# Patient Record
Sex: Female | Born: 1975 | Race: White | Hispanic: No | Marital: Single | Smoking: Never smoker
Health system: Southern US, Community
[De-identification: ages and names within clinical notes are randomized; demographics above are authoritative.]

## PROBLEM LIST (undated history)

## (undated) DIAGNOSIS — F329 Major depressive disorder, single episode, unspecified: Secondary | ICD-10-CM

## (undated) DIAGNOSIS — E039 Hypothyroidism, unspecified: Secondary | ICD-10-CM

## (undated) DIAGNOSIS — F32A Depression, unspecified: Secondary | ICD-10-CM

## (undated) DIAGNOSIS — F419 Anxiety disorder, unspecified: Secondary | ICD-10-CM

## (undated) HISTORY — PX: COSMETIC SURGERY: SHX468

---

## 2014-08-23 ENCOUNTER — Inpatient Hospital Stay: Payer: Self-pay | Admitting: Psychiatry

## 2014-08-23 LAB — URINALYSIS, COMPLETE
Bacteria: NONE SEEN
Bilirubin,UR: NEGATIVE
GLUCOSE, UR: NEGATIVE mg/dL (ref 0–75)
KETONE: NEGATIVE
LEUKOCYTE ESTERASE: NEGATIVE
Nitrite: NEGATIVE
PH: 6 (ref 4.5–8.0)
Protein: 30
Specific Gravity: 1.02 (ref 1.003–1.030)
Squamous Epithelial: 4
WBC UR: 12 /HPF (ref 0–5)

## 2014-08-23 LAB — DRUG SCREEN, URINE
Amphetamines, Ur Screen: NEGATIVE (ref ?–1000)
BARBITURATES, UR SCREEN: NEGATIVE (ref ?–200)
Benzodiazepine, Ur Scrn: POSITIVE (ref ?–200)
CANNABINOID 50 NG, UR ~~LOC~~: NEGATIVE (ref ?–50)
COCAINE METABOLITE, UR ~~LOC~~: NEGATIVE (ref ?–300)
MDMA (ECSTASY) UR SCREEN: NEGATIVE (ref ?–500)
Methadone, Ur Screen: NEGATIVE (ref ?–300)
OPIATE, UR SCREEN: NEGATIVE (ref ?–300)
Phencyclidine (PCP) Ur S: NEGATIVE (ref ?–25)
Tricyclic, Ur Screen: NEGATIVE (ref ?–1000)

## 2014-08-23 LAB — COMPREHENSIVE METABOLIC PANEL
ALK PHOS: 57 U/L
ALT: 22 U/L
Albumin: 4 g/dL (ref 3.4–5.0)
Anion Gap: 9 (ref 7–16)
BILIRUBIN TOTAL: 0.5 mg/dL (ref 0.2–1.0)
BUN: 7 mg/dL (ref 7–18)
Calcium, Total: 9.2 mg/dL (ref 8.5–10.1)
Chloride: 110 mmol/L — ABNORMAL HIGH (ref 98–107)
Co2: 26 mmol/L (ref 21–32)
Creatinine: 0.8 mg/dL (ref 0.60–1.30)
EGFR (African American): >60
EGFR (Non-African Amer.): >60
Glucose: 73 mg/dL (ref 65–99)
Osmolality: 285 (ref 275–301)
Potassium: 3.6 mmol/L (ref 3.5–5.1)
SGOT(AST): 28 U/L (ref 15–37)
Sodium: 145 mmol/L (ref 136–145)
Total Protein: 7.5 g/dL (ref 6.4–8.2)

## 2014-08-23 LAB — CBC
HCT: 42.3 % (ref 35.0–47.0)
HGB: 14.1 g/dL (ref 12.0–16.0)
MCH: 29.4 pg (ref 26.0–34.0)
MCHC: 33.3 g/dL (ref 32.0–36.0)
MCV: 88 fL (ref 80–100)
Platelet: 249 10*3/uL (ref 150–440)
RBC: 4.78 10*6/uL (ref 3.80–5.20)
RDW: 13.8 % (ref 11.5–14.5)
WBC: 8.6 10*3/uL (ref 3.6–11.0)

## 2014-08-23 LAB — SALICYLATE LEVEL: Salicylates, Serum: <1.7 mg/dL

## 2014-08-23 LAB — ACETAMINOPHEN LEVEL

## 2014-08-23 LAB — ETHANOL: ETHANOL LVL: 4 mg/dL

## 2014-08-23 LAB — TSH: Thyroid Stimulating Horm: 10.4 u[IU]/mL — ABNORMAL HIGH
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
## 2015-01-11 ENCOUNTER — Ambulatory Visit: Payer: Medicaid Other | Admitting: Psychiatry

## 2015-01-14 ENCOUNTER — Ambulatory Visit (INDEPENDENT_AMBULATORY_CARE_PROVIDER_SITE_OTHER): Payer: Medicaid Other | Admitting: Psychiatry

## 2015-01-14 ENCOUNTER — Encounter: Payer: Self-pay | Admitting: Psychiatry

## 2015-01-14 VITALS — BP 114/76 | HR 82 | Resp 12 | Ht 67.0 in

## 2015-01-14 DIAGNOSIS — F332 Major depressive disorder, recurrent severe without psychotic features: Secondary | ICD-10-CM | POA: Diagnosis not present

## 2015-01-14 DIAGNOSIS — Z8639 Personal history of other endocrine, nutritional and metabolic disease: Secondary | ICD-10-CM | POA: Insufficient documentation

## 2015-01-14 DIAGNOSIS — G4700 Insomnia, unspecified: Secondary | ICD-10-CM | POA: Insufficient documentation

## 2015-01-14 DIAGNOSIS — F411 Generalized anxiety disorder: Secondary | ICD-10-CM | POA: Insufficient documentation

## 2015-01-14 DIAGNOSIS — F331 Major depressive disorder, recurrent, moderate: Secondary | ICD-10-CM | POA: Insufficient documentation

## 2015-01-14 MED ORDER — ZOLPIDEM TARTRATE 10 MG PO TABS: 10.0000 mg | ORAL_TABLET | Freq: Every evening | ORAL | Status: DC | PRN

## 2015-01-14 MED ORDER — CLONAZEPAM 1 MG PO TABS: 1.0000 mg | ORAL_TABLET | Freq: Two times a day (BID) | ORAL | Status: DC

## 2015-01-14 MED ORDER — ARIPIPRAZOLE 5 MG PO TABS: 5.0000 mg | ORAL_TABLET | Freq: Every day | ORAL | Status: DC

## 2015-01-14 MED ORDER — BUPROPION HCL ER (XL) 300 MG PO TB24: 300.0000 mg | ORAL_TABLET | Freq: Every day | ORAL | Status: DC

## 2015-01-14 NOTE — Progress Notes (Signed)
BH MD/PA/NP OP Progress Note  01/14/2015 11:09 AM Kathleen Gray  MRN:  [id number]  Subjective:  Patient states that overall she is doing pretty well. She states that she's not been experiencing profound depression. She states that she's been sleeping good but relates it to a rigorous work schedule. She states working it does help her mood and her anxiety. She states that one of the issues she's notices that around the time of her menstrual cycle she can become very irritable and her mood fluctuates. His cussed how SSRIs can be useful for premenstrual dysphoric disorder. Patient has had prior trials of several SSRIs and does not like to take them. She also states she would not like to take 2 antidepressants as she is already on Wellbutrin. We did discuss adding a mood stabilizer such as Abilify, Rexulti. Given that is a concern of his augmentation for depression we have decided to initiate some Abilify in hopes of this also help her mood issues related to her menstrual cycle. Chief Complaint:  Visit Diagnosis:     ICD-9-CM ICD-10-CM   1. 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 No past surgical history on file. Family History: No family history on file. Social History:  History   Social History  . Marital Status: Single    Spouse Name: N/A  . Number of Children: N/A  . Years of Education: N/A   Social History Main Topics  . Smoking status: Never Smoker   . Smokeless tobacco: Not on file  . Alcohol Use: No  . Drug Use: No  . Sexual Activity: Not on file   Other Topics Concern  . None   Social History Narrative  . None   Additional History:   Assessment:   Musculoskeletal: Strength & Muscle Tone: within normal limits Gait & Station: normal Patient leans:  N/A  Psychiatric Specialty Exam: HPI  Review of Systems  Psychiatric/Behavioral: Negative for depression, suicidal ideas, hallucinations, memory loss and substance abuse. The patient is nervous/anxious. The patient does not have insomnia.     Blood pressure 114/76, pulse 82, resp. rate 12, height 5\' 7"  (1.702 m).There is no weight on file to calculate BMI.  General Appearance: Neat and Well Groomed  Eye Contact:  Good  Speech:  Clear and Coherent and Normal Rate  Volume:  Normal  Mood:  Good  Affect:  bright  Thought Process:  Linear and Logical  Orientation:  Full (Time, Place, and Person)  Thought Content:  Negative  Suicidal Thoughts:  No  Homicidal Thoughts:  No  Memory:  Immediate;   Good Recent;   Good Remote;   Good  Judgement:  Good  Insight:  Good  Psychomotor Activity:  Negative  Concentration:  Good  Recall:  Good  Fund of Knowledge: Good  Language: Good  Akathisia:  Negative  Handed:  Right unknown  AIMS (if indicated):  Done, normal  Assets:  Desire for Improvement Housing Vocational/Educational  ADL's:  Intact  Cognition: WNL  Sleep:  good   Is the patient at risk to self?  No. Has the patient been a risk to self in the past 6 months?  Yes.   Has the patient been a risk to self within the distant past?  Yes.  Is the patient a risk to others?  No. Has the patient been a risk to others in the past 6 months?  No. Has the patient been a risk to others within the distant past?  No.  Current Medications: Current Outpatient Prescriptions  Medication Sig Dispense Refill  . buPROPion (WELLBUTRIN XL) 300 MG 24 hr tablet Take 1 tablet (300 mg total) by mouth daily. 30 tablet 1  . clonazePAM (KLONOPIN) 1 MG tablet Take 1 tablet (1 mg total) by mouth 2 (two) times daily. 60 tablet 1  . zolpidem (AMBIEN) 10 MG tablet Take 1 tablet (10 mg total) by mouth at bedtime as needed for sleep. 30 tablet 1  . levothyroxine (SYNTHROID) 150 MCG tablet Take by mouth.     No  current facility-administered medications for this visit.    Medical Decision Making:  Established Problem, Stable/Improving (1), Review of Medication Regimen & Side Effects (2) and Review of New Medication or Change in Dosage (2)  Treatment Plan Summary:Medication management we will continue the patient on her Wellbutrin XL 3 mg a day. We'll continue Klonopin 1 mg twice a day. We'll continue her Ambien 10 mg at bedtime as needed. We will start some Abilify 5 mg daily. Risk and benefits of been discussed patient's able to consent. She's been given laboratory slip for lipid profile, glucose and prolactin level. She'll follow up in 2 months. She's been encouraged call any questions or concerns prior to her next appointment.   Wallace Going 01/14/2015, 11:09 AM

## 2015-02-28 ENCOUNTER — Emergency Department
Admission: EM | Admit: 2015-02-28 | Discharge: 2015-03-01 | Disposition: A | Discharge status: Other Acute Inpatient Hospital | Payer: BLUE CROSS/BLUE SHIELD | Attending: Emergency Medicine | Admitting: Emergency Medicine

## 2015-02-28 ENCOUNTER — Encounter: Payer: Self-pay | Admitting: Emergency Medicine

## 2015-02-28 DIAGNOSIS — F419 Anxiety disorder, unspecified: Secondary | ICD-10-CM | POA: Diagnosis not present

## 2015-02-28 DIAGNOSIS — F1310 Sedative, hypnotic or anxiolytic abuse, uncomplicated: Secondary | ICD-10-CM | POA: Insufficient documentation

## 2015-02-28 DIAGNOSIS — T450X2A Poisoning by antiallergic and antiemetic drugs, intentional self-harm, initial encounter: Secondary | ICD-10-CM | POA: Diagnosis not present

## 2015-02-28 DIAGNOSIS — F329 Major depressive disorder, single episode, unspecified: Secondary | ICD-10-CM | POA: Insufficient documentation

## 2015-02-28 DIAGNOSIS — Y9389 Activity, other specified: Secondary | ICD-10-CM | POA: Diagnosis not present

## 2015-02-28 DIAGNOSIS — T424X2A Poisoning by benzodiazepines, intentional self-harm, initial encounter: Secondary | ICD-10-CM | POA: Diagnosis present

## 2015-02-28 DIAGNOSIS — T1491XA Suicide attempt, initial encounter: Secondary | ICD-10-CM

## 2015-02-28 DIAGNOSIS — T426X2A Poisoning by other antiepileptic and sedative-hypnotic drugs, intentional self-harm, initial encounter: Secondary | ICD-10-CM | POA: Insufficient documentation

## 2015-02-28 DIAGNOSIS — Z79899 Other long term (current) drug therapy: Secondary | ICD-10-CM | POA: Insufficient documentation

## 2015-02-28 DIAGNOSIS — Z3202 Encounter for pregnancy test, result negative: Secondary | ICD-10-CM | POA: Diagnosis not present

## 2015-02-28 DIAGNOSIS — Y9289 Other specified places as the place of occurrence of the external cause: Secondary | ICD-10-CM | POA: Diagnosis not present

## 2015-02-28 DIAGNOSIS — Y998 Other external cause status: Secondary | ICD-10-CM | POA: Insufficient documentation

## 2015-02-28 DIAGNOSIS — T50902A Poisoning by unspecified drugs, medicaments and biological substances, intentional self-harm, initial encounter: Secondary | ICD-10-CM

## 2015-02-28 HISTORY — DX: Hypothyroidism, unspecified: E03.9

## 2015-02-28 HISTORY — DX: Major depressive disorder, single episode, unspecified: F32.9

## 2015-02-28 HISTORY — DX: Anxiety disorder, unspecified: F41.9

## 2015-02-28 HISTORY — DX: Depression, unspecified: F32.A

## 2015-02-28 NOTE — ED Notes (Signed)
Brown flip flops, grey sweatpants, pink print panties, black tank top, black bra removed and placed in pt belongings bag with samsung cell phone; pt dressed in behav scrubs

## 2015-02-28 NOTE — ED Notes (Signed)
Patient ambulatory to triage with steady gait, without difficulty or distress noted, brought in by [facility] PD officer for suicide attempt, IVC; reports pt took OD ambien and clonazapem then tried to funnel carbon dioxide into her car; friends found suicide note; pt admits to taking xanax-30, klonopin-30 and sleeping pills-30 and aspirin at 5pm; st previous attempt

## 2015-03-01 ENCOUNTER — Inpatient Hospital Stay
Admit: 2015-03-01 | Discharge: 2015-03-02 | DRG: 885 | Disposition: A | Discharge status: Home / Self Care | Payer: BLUE CROSS/BLUE SHIELD | Source: Intra-hospital | Attending: Psychiatry | Admitting: Psychiatry

## 2015-03-01 ENCOUNTER — Encounter: Payer: Self-pay | Admitting: Psychiatry

## 2015-03-01 DIAGNOSIS — Z818 Family history of other mental and behavioral disorders: Secondary | ICD-10-CM

## 2015-03-01 DIAGNOSIS — Z9119 Patient's noncompliance with other medical treatment and regimen: Secondary | ICD-10-CM | POA: Diagnosis present

## 2015-03-01 DIAGNOSIS — E039 Hypothyroidism, unspecified: Secondary | ICD-10-CM | POA: Diagnosis present

## 2015-03-01 DIAGNOSIS — G4700 Insomnia, unspecified: Secondary | ICD-10-CM | POA: Diagnosis present

## 2015-03-01 DIAGNOSIS — F332 Major depressive disorder, recurrent severe without psychotic features: Principal | ICD-10-CM | POA: Diagnosis present

## 2015-03-01 DIAGNOSIS — T424X2A Poisoning by benzodiazepines, intentional self-harm, initial encounter: Secondary | ICD-10-CM | POA: Diagnosis not present

## 2015-03-01 DIAGNOSIS — Z79899 Other long term (current) drug therapy: Secondary | ICD-10-CM

## 2015-03-01 DIAGNOSIS — Z915 Personal history of self-harm: Secondary | ICD-10-CM | POA: Diagnosis not present

## 2015-03-01 DIAGNOSIS — F411 Generalized anxiety disorder: Secondary | ICD-10-CM | POA: Diagnosis present

## 2015-03-01 LAB — COMPREHENSIVE METABOLIC PANEL
ALBUMIN: 3.8 g/dL (ref 3.5–5.0)
ALT: 12 U/L — AB (ref 14–54)
ALT: 14 U/L (ref 14–54)
ANION GAP: 12 (ref 5–15)
ANION GAP: 7 (ref 5–15)
AST: 18 U/L (ref 15–41)
AST: 19 U/L (ref 15–41)
Albumin: 4.3 g/dL (ref 3.5–5.0)
Alkaline Phosphatase: 36 U/L — ABNORMAL LOW (ref 38–126)
Alkaline Phosphatase: 45 U/L (ref 38–126)
BILIRUBIN TOTAL: 0.2 mg/dL — AB (ref 0.3–1.2)
BILIRUBIN TOTAL: 0.4 mg/dL (ref 0.3–1.2)
BUN: 10 mg/dL (ref 6–20)
BUN: 10 mg/dL (ref 6–20)
CHLORIDE: 111 mmol/L (ref 101–111)
CO2: 18 mmol/L — AB (ref 22–32)
CO2: 20 mmol/L — ABNORMAL LOW (ref 22–32)
CREATININE: 0.73 mg/dL (ref 0.44–1.00)
Calcium: 8.5 mg/dL — ABNORMAL LOW (ref 8.9–10.3)
Calcium: 9.5 mg/dL (ref 8.9–10.3)
Chloride: 113 mmol/L — ABNORMAL HIGH (ref 101–111)
Creatinine, Ser: 0.74 mg/dL (ref 0.44–1.00)
GFR calc Af Amer: >60 mL/min (ref >60)
GFR calc Af Amer: >60 mL/min (ref >60)
GFR calc non Af Amer: >60 mL/min (ref >60)
Glucose, Bld: 102 mg/dL — ABNORMAL HIGH (ref 65–99)
Glucose, Bld: 104 mg/dL — ABNORMAL HIGH (ref 65–99)
Potassium: 3.4 mmol/L — ABNORMAL LOW (ref 3.5–5.1)
Potassium: 3.7 mmol/L (ref 3.5–5.1)
SODIUM: 140 mmol/L (ref 135–145)
SODIUM: 141 mmol/L (ref 135–145)
Total Protein: 6.7 g/dL (ref 6.5–8.1)
Total Protein: 7.7 g/dL (ref 6.5–8.1)

## 2015-03-01 LAB — URINE DRUG SCREEN, QUALITATIVE (ARMC ONLY)
AMPHETAMINES, UR SCREEN: NOT DETECTED
BENZODIAZEPINE, UR SCRN: POSITIVE — AB
Barbiturates, Ur Screen: NOT DETECTED
CANNABINOID 50 NG, UR ~~LOC~~: NOT DETECTED
COCAINE METABOLITE, UR ~~LOC~~: NOT DETECTED
MDMA (Ecstasy)Ur Screen: NOT DETECTED
METHADONE SCREEN, URINE: NOT DETECTED
Opiate, Ur Screen: NOT DETECTED
Phencyclidine (PCP) Ur S: NOT DETECTED
TRICYCLIC, UR SCREEN: NOT DETECTED

## 2015-03-01 LAB — CBC
HCT: 44 % (ref 35.0–47.0)
Hemoglobin: 14.6 g/dL (ref 12.0–16.0)
MCH: 28.6 pg (ref 26.0–34.0)
MCHC: 33.2 g/dL (ref 32.0–36.0)
MCV: 86.1 fL (ref 80.0–100.0)
Platelets: 287 10*3/uL (ref 150–440)
RBC: 5.11 MIL/uL (ref 3.80–5.20)
RDW: 13.3 % (ref 11.5–14.5)
WBC: 12.2 10*3/uL — AB (ref 3.6–11.0)

## 2015-03-01 LAB — BLOOD GAS, ARTERIAL
ACID-BASE DEFICIT: 3.3 mmol/L — AB (ref 0.0–2.0)
Bicarbonate: 18.6 mEq/L — ABNORMAL LOW (ref 21.0–28.0)
FIO2: 0.21
O2 Saturation: 98.8 %
PCO2 ART: 25 mmHg — AB (ref 32.0–48.0)
Patient temperature: 37
pH, Arterial: 7.48 — ABNORMAL HIGH (ref 7.350–7.450)
pO2, Arterial: 116 mmHg — ABNORMAL HIGH (ref 83.0–108.0)

## 2015-03-01 LAB — ACETAMINOPHEN LEVEL
ACETAMINOPHEN (TYLENOL), SERUM: 79 ug/mL — AB (ref 10–30)
Acetaminophen (Tylenol), Serum: 20 ug/mL (ref 10–30)

## 2015-03-01 LAB — SALICYLATE LEVEL
SALICYLATE LVL: 19.9 mg/dL (ref 2.8–30.0)
SALICYLATE LVL: 32.4 mg/dL — AB (ref 2.8–30.0)

## 2015-03-01 LAB — CARBOXYHEMOGLOBIN
Carboxyhemoglobin: 2.3 % (ref 1.5–9.0)
O2 Saturation: 98.4 %
Total oxygen content: 94.8 mL/dL

## 2015-03-01 LAB — ETHANOL: ALCOHOL ETHYL (B): 12 mg/dL — AB (ref <5)

## 2015-03-01 LAB — POCT PREGNANCY, URINE: Preg Test, Ur: NEGATIVE

## 2015-03-01 MED ORDER — ONDANSETRON HCL 4 MG/2ML IJ SOLN
INTRAMUSCULAR | Status: AC
  Administered 2015-03-01: 4 mg
  Filled 2015-03-01: qty 2

## 2015-03-01 MED ORDER — ARIPIPRAZOLE 5 MG PO TABS
5.0000 mg | ORAL_TABLET | Freq: Every day | ORAL | Status: DC
  Filled 2015-03-01: qty 1

## 2015-03-01 MED ORDER — ALUM & MAG HYDROXIDE-SIMETH 200-200-20 MG/5ML PO SUSP: 30.0000 mL | ORAL | Status: DC | PRN

## 2015-03-01 MED ORDER — LEVOTHYROXINE SODIUM 75 MCG PO TABS
150.0000 ug | ORAL_TABLET | Freq: Every day | ORAL | Status: DC
  Administered 2015-03-02: 150 ug via ORAL
  Filled 2015-03-01: qty 2

## 2015-03-01 MED ORDER — SODIUM CHLORIDE 0.9 % IV SOLN: Freq: Once | INTRAVENOUS | Status: AC

## 2015-03-01 MED ORDER — ZOLPIDEM TARTRATE 5 MG PO TABS
5.0000 mg | ORAL_TABLET | Freq: Once | ORAL | Status: AC
  Administered 2015-03-01: 5 mg via ORAL
  Filled 2015-03-01: qty 1

## 2015-03-01 MED ORDER — SODIUM CHLORIDE 0.9 % IV BOLUS (SEPSIS)
1000.0000 mL | Freq: Once | INTRAVENOUS | Status: AC
  Administered 2015-03-01: 1000 mL via INTRAVENOUS

## 2015-03-01 MED ORDER — MAGNESIUM HYDROXIDE 400 MG/5ML PO SUSP: 30.0000 mL | Freq: Every day | ORAL | Status: DC | PRN

## 2015-03-01 MED ORDER — BUPROPION HCL ER (XL) 150 MG PO TB24
300.0000 mg | ORAL_TABLET | Freq: Every day | ORAL | Status: DC
  Administered 2015-03-02: 300 mg via ORAL
  Filled 2015-03-01 (×2): qty 2

## 2015-03-01 NOTE — ED Notes (Signed)
BEHAVIORAL HEALTH ROUNDING Patient sleeping: No. Patient alert and oriented: yes Behavior appropriate: Yes.  ; If no, describe:  Nutrition and fluids offered: Yes  Toileting and hygiene offered: Yes  Sitter present: no Law enforcement present: Yes  

## 2015-03-01 NOTE — ED Provider Notes (Addendum)
-----------------------------------------   8:06 AM on 03/01/2015 -----------------------------------------   Blood pressure 112/71, pulse 88, temperature 98.5 F (36.9 C), temperature source Oral, resp. rate 24, height  (1.702 m), weight 150 lb (68.04 kg), last menstrual period 02/28/2015, SpO2 97 %.  The patient had no acute events since last update.  Calm and cooperative at this time.  Disposition is pending per Psychiatry/Behavioral Medicine team recommendations.     Arnaldo Natal, MD 03/01/15 [id number]  Aspirin and Tylenol levels have returned they are lower considerably lower. CMP shows normal liver functions and increasing bicarbonate will allow the patient to leave the ER go to psychiatry.  Arnaldo Natal, MD 03/01/15 [id number]

## 2015-03-01 NOTE — ED Notes (Signed)
Patient assigned to appropriate care area. Patient oriented to unit/care area: Informed that, for their safety, care areas are designed for safety and monitored by security cameras at all times; and visiting hours explained to patient. Patient verbalizes understanding, and verbal contract for safety obtained. 

## 2015-03-01 NOTE — [facility] Group Notes (Signed)
[facility] Group Notes:  (Nursing/MHT/Case Management/Adjunct)  Date:  03/01/2015  Time:  5:24 PM  Type of Therapy:  Psychoeducational Skills  Participation Level:  Did Not Attend   Summary of Progress/Problems:  Kathleen Gray 03/01/2015, 5:24 PM

## 2015-03-01 NOTE — ED Notes (Signed)
BEHAVIORAL HEALTH ROUNDING Patient sleeping: Yes.   Patient alert and oriented: yes Behavior appropriate: Yes.  ; If no, describe:  Nutrition and fluids offered: Yes  Toileting and hygiene offered: Yes  Sitter present: no Law enforcement present: Yes [facility] security officer 

## 2015-03-01 NOTE — H&P (Addendum)
Psychiatric Admission Assessment Adult  Patient Identification: Kathleen Gray MRN:  [id number] Date of Evaluation:  03/01/2015 Chief Complaint:  Major Depression recurrent severe Principal Diagnosis: Major depressive disorder, recurrent severe without psychotic features Diagnosis:   Patient Active Problem List   Diagnosis Date Noted  . Major depressive disorder, recurrent severe without psychotic features [F33.2] 03/01/2015  . Anxiety, generalized [F41.1] 01/14/2015  . H/O: hypothyroidism [Z86.39] 01/14/2015  . Insomnia, persistent [G47.00] 01/14/2015  . Depression, major, recurrent, moderate [F33.1] 01/14/2015   History of Present Illness::   Identifying data. Kathleen Gray is a 39 year old female with history of depression and suicide attempts.  Chief complaint. "I overdosed."  History of present illness. Kathleen Gray has a long history of depression. She has been in the care of Dr. Jimmye Norman. She has been maintained on Wellbutrin for depression. She did see Dr. Jimmye Norman on July 17. Abilify was added to Wellbutrin for augmentation. At the patient comes to the emergency room after an overdose on multiple medications. She reportedly left a suicide note. She also admits that she tried to poison himself with carbon monoxide in her car. She is not forthcoming with information but apparently she broke up with her boyfriend of 7 years. She was hospitalized at [facility] in January 2016 in similar circumstances were again and breakup in the relationship was the driving force behind behind a suicide. The patient reports poor sleep, decreased appetite, anhedonia, feeling of guilt and hopelessness worthlessness, poor energy and concentration, social isolation, crying spells and suicidal ideation leading to this current attempt. She reports increased anxiety and paninsomnia. She denies psychotic symptoms. She denies symptoms suggestive of bipolar mania. She denies alcohol or illicit substance  use. Urine tox screen was not done in the emergency room.  Past psychiatric history. There was one suicide attempt by overdose at the age of 45 and another one in January 2016. At the patient is rather pessimistic about the role of medications and feels that the nothing ever helped. As she is frequently noncompliant with treatment. According to Dr. Bebe Liter note as she reluctantly agreed to add Abilify to her regimen.  Family psychiatric history. Father with depression and substance abuse deceased now.  Social history. She has 2 children. She has been with her boyfriend for 7 years. They separated in April. On the day of suicide, he dropped his pictures and her old letters at her car. She felt overwhelmed and suicidal. She never graduated from high school but did obtain GED's. She believes that she has learning disability and it takes her much longer to learn new skills. 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  Total Time spent with patient: 1 hour  Past Medical History:  Past Medical History  Diagnosis Date  . Hypothyroidism   . Depression   . Anxiety     Past Surgical History  Procedure Laterality Date  . Cosmetic surgery     Family History: History reviewed. No pertinent family history. Social History:  History  Alcohol Use  . 0.0 oz/week  . 0 Standard drinks or equivalent per week     History  Drug Use No    History   Social History  . Marital Status: Single    Spouse Name: N/A  . Number of Children: N/A  . Years of Education: N/A   Social History Main Topics  . Smoking status: Never Smoker   . Smokeless tobacco: Not  on file  . Alcohol Use: 0.0 oz/week    0 Standard drinks or equivalent per week  . Drug Use: No  . Sexual Activity: Not on file   Other Topics Concern  . None   Social History Narrative   Additional Social History:                          Musculoskeletal: Strength &  Muscle Tone: within normal limits Gait & Station: normal Patient leans: N/A  Psychiatric Specialty Exam: Physical Exam  Nursing note and vitals reviewed.   Review of Systems  All other systems reviewed and are negative.   Last menstrual period 02/28/2015.There is no weight on file to calculate BMI.  See SRA.                                                  Sleep:      Risk to Self:   Risk to Others:   Prior Inpatient Therapy:   Prior Outpatient Therapy:    Alcohol Screening:    Allergies:  No Known Allergies Lab Results:  Results for orders placed or performed during the hospital encounter of 02/28/15 (from the past 48 hour(s))  Comprehensive metabolic panel     Status: Abnormal   Collection Time: 02/28/15 11:36 PM  Result Value Ref Range   Sodium 141 135 - 145 mmol/L   Potassium 3.4 (L) 3.5 - 5.1 mmol/L   Chloride 111 101 - 111 mmol/L   CO2 18 (L) 22 - 32 mmol/L   Glucose, Bld 104 (H) 65 - 99 mg/dL   BUN 10 6 - 20 mg/dL   Creatinine, Ser 0.74 0.44 - 1.00 mg/dL   Calcium 9.5 8.9 - 10.3 mg/dL   Total Protein 7.7 6.5 - 8.1 g/dL   Albumin 4.3 3.5 - 5.0 g/dL   AST 19 15 - 41 U/L   ALT 14 14 - 54 U/L   Alkaline Phosphatase 45 38 - 126 U/L   Total Bilirubin 0.2 (L) 0.3 - 1.2 mg/dL   GFR calc non Af Amer >60 >60 mL/min   GFR calc Af Amer >60 >60 mL/min    Comment: (NOTE) The eGFR has been calculated using the CKD EPI equation. This calculation has not been validated in all clinical situations. eGFR's persistently <60 mL/min signify possible Chronic Kidney Disease.    Anion gap 12 5 - 15  Ethanol (ETOH)     Status: Abnormal   Collection Time: 02/28/15 11:36 PM  Result Value Ref Range   Alcohol, Ethyl (B) 12 (H) <5 mg/dL    Comment:        LOWEST DETECTABLE LIMIT FOR SERUM ALCOHOL IS 5 mg/dL FOR MEDICAL PURPOSES ONLY   Salicylate level     Status: Abnormal   Collection Time: 02/28/15 11:36 PM  Result Value Ref Range   Salicylate Lvl 93.8  (HH) 2.8 - 30.0 mg/dL    Comment: CRITICAL RESULT CALLED TO, READ BACK BY AND VERIFIED WITH BUTCH WOODS @ 0126 8.2.16 MPG   Acetaminophen level     Status: Abnormal   Collection Time: 02/28/15 11:36 PM  Result Value Ref Range   Acetaminophen (Tylenol), Serum 79 (H) 10 - 30 ug/mL    Comment:        THERAPEUTIC CONCENTRATIONS VARY SIGNIFICANTLY. A RANGE OF  10-30 ug/mL MAY BE AN EFFECTIVE CONCENTRATION FOR MANY PATIENTS. HOWEVER, SOME ARE BEST TREATED AT CONCENTRATIONS OUTSIDE THIS RANGE. ACETAMINOPHEN CONCENTRATIONS >150 ug/mL AT 4 HOURS AFTER INGESTION AND >50 ug/mL AT 12 HOURS AFTER INGESTION ARE OFTEN ASSOCIATED WITH TOXIC REACTIONS.   CBC     Status: Abnormal   Collection Time: 02/28/15 11:36 PM  Result Value Ref Range   WBC 12.2 (H) 3.6 - 11.0 K/uL   RBC 5.11 3.80 - 5.20 MIL/uL   Hemoglobin 14.6 12.0 - 16.0 g/dL   HCT 44.0 35.0 - 47.0 %   MCV 86.1 80.0 - 100.0 fL   MCH 28.6 26.0 - 34.0 pg   MCHC 33.2 32.0 - 36.0 g/dL   RDW 13.3 11.5 - 14.5 %   Platelets 287 150 - 440 K/uL  Urine Drug Screen, Qualitative ([facility] only)     Status: Abnormal   Collection Time: 02/28/15 11:36 PM  Result Value Ref Range   Tricyclic, Ur Screen NONE DETECTED NONE DETECTED   Amphetamines, Ur Screen NONE DETECTED NONE DETECTED   MDMA (Ecstasy)Ur Screen NONE DETECTED NONE DETECTED   Cocaine Metabolite,Ur [place] NONE DETECTED NONE DETECTED   Opiate, Ur Screen NONE DETECTED NONE DETECTED   Phencyclidine (PCP) Ur S NONE DETECTED NONE DETECTED   Cannabinoid 50 Ng, Ur [place] NONE DETECTED NONE DETECTED   Barbiturates, Ur Screen NONE DETECTED NONE DETECTED   Benzodiazepine, Ur Scrn POSITIVE (A) NONE DETECTED   Methadone Scn, Ur NONE DETECTED NONE DETECTED    Comment: (NOTE) 696  Tricyclics, urine               Cutoff 1000 ng/mL 200  Amphetamines, urine             Cutoff 1000 ng/mL 300  MDMA (Ecstasy), urine           Cutoff 500 ng/mL 400  Cocaine Metabolite, urine       Cutoff 300 ng/mL 500  Opiate,  urine                   Cutoff 300 ng/mL 600  Phencyclidine (PCP), urine      Cutoff 25 ng/mL 700  Cannabinoid, urine              Cutoff 50 ng/mL 800  Barbiturates, urine             Cutoff 200 ng/mL 900  Benzodiazepine, urine           Cutoff 200 ng/mL 1000 Methadone, urine                Cutoff 300 ng/mL 1100 1200 The urine drug screen provides only a preliminary, unconfirmed 1300 analytical test result and should not be used for non-medical 1400 purposes. Clinical consideration and professional judgment should 1500 be applied to any positive drug screen result due to possible 1600 interfering substances. A more specific alternate chemical method 1700 must be used in order to obtain a confirmed analytical result.  1800 Gas chromato graphy / mass spectrometry (GC/MS) is the preferred 1900 confirmatory method.   Pregnancy, urine POC     Status: None   Collection Time: 03/01/15 12:25 AM  Result Value Ref Range   Preg Test, Ur NEGATIVE NEGATIVE    Comment:        THE SENSITIVITY OF THIS METHODOLOGY IS >24 mIU/mL   Carboxyhemoglobin     Status: None   Collection Time: 03/01/15 12:27 AM  Result Value Ref Range   O2 Saturation  98.4 %   Carboxyhemoglobin 2.3 1.5 - 9.0 %   Total oxygen content 94.8 mL/dL  Blood gas, arterial ([facility] ONLY)     Status: Abnormal   Collection Time: 03/01/15  4:42 AM  Result Value Ref Range   FIO2 0.21    pH, Arterial 7.48 (H) 7.350 - 7.450   pCO2 arterial 25 (L) 32.0 - 48.0 mmHg   pO2, Arterial 116 (H) 83.0 - 108.0 mmHg   Bicarbonate 18.6 (L) 21.0 - 28.0 mEq/L   Acid-base deficit 3.3 (H) 0.0 - 2.0 mmol/L   O2 Saturation 98.8 %   Patient temperature 37.0    Collection site RIGHT RADIAL    Sample type ARTERIAL DRAW    Allens test (pass/fail) PASS PASS  Comprehensive metabolic panel     Status: Abnormal   Collection Time: 03/01/15  9:36 AM  Result Value Ref Range   Sodium 140 135 - 145 mmol/L   Potassium 3.7 3.5 - 5.1 mmol/L   Chloride 113 (H)  101 - 111 mmol/L   CO2 20 (L) 22 - 32 mmol/L   Glucose, Bld 102 (H) 65 - 99 mg/dL   BUN 10 6 - 20 mg/dL   Creatinine, Ser 0.73 0.44 - 1.00 mg/dL   Calcium 8.5 (L) 8.9 - 10.3 mg/dL   Total Protein 6.7 6.5 - 8.1 g/dL   Albumin 3.8 3.5 - 5.0 g/dL   AST 18 15 - 41 U/L   ALT 12 (L) 14 - 54 U/L   Alkaline Phosphatase 36 (L) 38 - 126 U/L   Total Bilirubin 0.4 0.3 - 1.2 mg/dL   GFR calc non Af Amer >60 >60 mL/min   GFR calc Af Amer >60 >60 mL/min    Comment: (NOTE) The eGFR has been calculated using the CKD EPI equation. This calculation has not been validated in all clinical situations. eGFR's persistently <60 mL/min signify possible Chronic Kidney Disease.    Anion gap 7 5 - 15  Salicylate level     Status: None   Collection Time: 03/01/15 12:22 PM  Result Value Ref Range   Salicylate Lvl 24.2 2.8 - 30.0 mg/dL  Acetaminophen level     Status: None   Collection Time: 03/01/15 12:22 PM  Result Value Ref Range   Acetaminophen (Tylenol), Serum 20 10 - 30 ug/mL    Comment:        THERAPEUTIC CONCENTRATIONS VARY SIGNIFICANTLY. A RANGE OF 10-30 ug/mL MAY BE AN EFFECTIVE CONCENTRATION FOR MANY PATIENTS. HOWEVER, SOME ARE BEST TREATED AT CONCENTRATIONS OUTSIDE THIS RANGE. ACETAMINOPHEN CONCENTRATIONS >150 ug/mL AT 4 HOURS AFTER INGESTION AND >50 ug/mL AT 12 HOURS AFTER INGESTION ARE OFTEN ASSOCIATED WITH TOXIC REACTIONS.    Current Medications: No current facility-administered medications for this encounter.   PTA Medications: Prescriptions prior to admission  Medication Sig Dispense Refill Last Dose  . ARIPiprazole (ABILIFY) 5 MG tablet Take 1 tablet (5 mg total) by mouth daily. 30 tablet 1   . buPROPion (WELLBUTRIN XL) 300 MG 24 hr tablet Take 1 tablet (300 mg total) by mouth daily. 30 tablet 1   . clonazePAM (KLONOPIN) 1 MG tablet Take 1 tablet (1 mg total) by mouth 2 (two) times daily. 60 tablet 1 unknown at unknown  . levothyroxine (SYNTHROID) 150 MCG tablet Take by mouth.      . zolpidem (AMBIEN) 10 MG tablet Take 1 tablet (10 mg total) by mouth at bedtime as needed for sleep. 30 tablet 1 PRN at PRN  Previous Psychotropic Medications: Yes   Substance Abuse History in the last 12 months:  No.    Consequences of Substance Abuse: NA  Results for orders placed or performed during the hospital encounter of 02/28/15 (from the past 72 hour(s))  Comprehensive metabolic panel     Status: Abnormal   Collection Time: 02/28/15 11:36 PM  Result Value Ref Range   Sodium 141 135 - 145 mmol/L   Potassium 3.4 (L) 3.5 - 5.1 mmol/L   Chloride 111 101 - 111 mmol/L   CO2 18 (L) 22 - 32 mmol/L   Glucose, Bld 104 (H) 65 - 99 mg/dL   BUN 10 6 - 20 mg/dL   Creatinine, Ser 0.74 0.44 - 1.00 mg/dL   Calcium 9.5 8.9 - 10.3 mg/dL   Total Protein 7.7 6.5 - 8.1 g/dL   Albumin 4.3 3.5 - 5.0 g/dL   AST 19 15 - 41 U/L   ALT 14 14 - 54 U/L   Alkaline Phosphatase 45 38 - 126 U/L   Total Bilirubin 0.2 (L) 0.3 - 1.2 mg/dL   GFR calc non Af Amer >60 >60 mL/min   GFR calc Af Amer >60 >60 mL/min    Comment: (NOTE) The eGFR has been calculated using the CKD EPI equation. This calculation has not been validated in all clinical situations. eGFR's persistently <60 mL/min signify possible Chronic Kidney Disease.    Anion gap 12 5 - 15  Ethanol (ETOH)     Status: Abnormal   Collection Time: 02/28/15 11:36 PM  Result Value Ref Range   Alcohol, Ethyl (B) 12 (H) <5 mg/dL    Comment:        LOWEST DETECTABLE LIMIT FOR SERUM ALCOHOL IS 5 mg/dL FOR MEDICAL PURPOSES ONLY   Salicylate level     Status: Abnormal   Collection Time: 02/28/15 11:36 PM  Result Value Ref Range   Salicylate Lvl 81.0 (HH) 2.8 - 30.0 mg/dL    Comment: CRITICAL RESULT CALLED TO, READ BACK BY AND VERIFIED WITH BUTCH WOODS @ 0126 8.2.16 MPG   Acetaminophen level     Status: Abnormal   Collection Time: 02/28/15 11:36 PM  Result Value Ref Range   Acetaminophen (Tylenol), Serum 79 (H) 10 - 30 ug/mL    Comment:         THERAPEUTIC CONCENTRATIONS VARY SIGNIFICANTLY. A RANGE OF 10-30 ug/mL MAY BE AN EFFECTIVE CONCENTRATION FOR MANY PATIENTS. HOWEVER, SOME ARE BEST TREATED AT CONCENTRATIONS OUTSIDE THIS RANGE. ACETAMINOPHEN CONCENTRATIONS >150 ug/mL AT 4 HOURS AFTER INGESTION AND >50 ug/mL AT 12 HOURS AFTER INGESTION ARE OFTEN ASSOCIATED WITH TOXIC REACTIONS.   CBC     Status: Abnormal   Collection Time: 02/28/15 11:36 PM  Result Value Ref Range   WBC 12.2 (H) 3.6 - 11.0 K/uL   RBC 5.11 3.80 - 5.20 MIL/uL   Hemoglobin 14.6 12.0 - 16.0 g/dL   HCT 44.0 35.0 - 47.0 %   MCV 86.1 80.0 - 100.0 fL   MCH 28.6 26.0 - 34.0 pg   MCHC 33.2 32.0 - 36.0 g/dL   RDW 13.3 11.5 - 14.5 %   Platelets 287 150 - 440 K/uL  Urine Drug Screen, Qualitative ([facility] only)     Status: Abnormal   Collection Time: 02/28/15 11:36 PM  Result Value Ref Range   Tricyclic, Ur Screen NONE DETECTED NONE DETECTED   Amphetamines, Ur Screen NONE DETECTED NONE DETECTED   MDMA (Ecstasy)Ur Screen NONE DETECTED NONE DETECTED   Cocaine Metabolite,Ur [place] NONE  DETECTED NONE DETECTED   Opiate, Ur Screen NONE DETECTED NONE DETECTED   Phencyclidine (PCP) Ur S NONE DETECTED NONE DETECTED   Cannabinoid 50 Ng, Ur [place] NONE DETECTED NONE DETECTED   Barbiturates, Ur Screen NONE DETECTED NONE DETECTED   Benzodiazepine, Ur Scrn POSITIVE (A) NONE DETECTED   Methadone Scn, Ur NONE DETECTED NONE DETECTED    Comment: (NOTE) 027  Tricyclics, urine               Cutoff 1000 ng/mL 200  Amphetamines, urine             Cutoff 1000 ng/mL 300  MDMA (Ecstasy), urine           Cutoff 500 ng/mL 400  Cocaine Metabolite, urine       Cutoff 300 ng/mL 500  Opiate, urine                   Cutoff 300 ng/mL 600  Phencyclidine (PCP), urine      Cutoff 25 ng/mL 700  Cannabinoid, urine              Cutoff 50 ng/mL 800  Barbiturates, urine             Cutoff 200 ng/mL 900  Benzodiazepine, urine           Cutoff 200 ng/mL 1000 Methadone, urine                Cutoff  300 ng/mL 1100 1200 The urine drug screen provides only a preliminary, unconfirmed 1300 analytical test result and should not be used for non-medical 1400 purposes. Clinical consideration and professional judgment should 1500 be applied to any positive drug screen result due to possible 1600 interfering substances. A more specific alternate chemical method 1700 must be used in order to obtain a confirmed analytical result.  1800 Gas chromato graphy / mass spectrometry (GC/MS) is the preferred 1900 confirmatory method.   Pregnancy, urine POC     Status: None   Collection Time: 03/01/15 12:25 AM  Result Value Ref Range   Preg Test, Ur NEGATIVE NEGATIVE    Comment:        THE SENSITIVITY OF THIS METHODOLOGY IS >24 mIU/mL   Carboxyhemoglobin     Status: None   Collection Time: 03/01/15 12:27 AM  Result Value Ref Range   O2 Saturation 98.4 %   Carboxyhemoglobin 2.3 1.5 - 9.0 %   Total oxygen content 94.8 mL/dL  Blood gas, arterial ([facility] ONLY)     Status: Abnormal   Collection Time: 03/01/15  4:42 AM  Result Value Ref Range   FIO2 0.21    pH, Arterial 7.48 (H) 7.350 - 7.450   pCO2 arterial 25 (L) 32.0 - 48.0 mmHg   pO2, Arterial 116 (H) 83.0 - 108.0 mmHg   Bicarbonate 18.6 (L) 21.0 - 28.0 mEq/L   Acid-base deficit 3.3 (H) 0.0 - 2.0 mmol/L   O2 Saturation 98.8 %   Patient temperature 37.0    Collection site RIGHT RADIAL    Sample type ARTERIAL DRAW    Allens test (pass/fail) PASS PASS  Comprehensive metabolic panel     Status: Abnormal   Collection Time: 03/01/15  9:36 AM  Result Value Ref Range   Sodium 140 135 - 145 mmol/L   Potassium 3.7 3.5 - 5.1 mmol/L   Chloride 113 (H) 101 - 111 mmol/L   CO2 20 (L) 22 - 32 mmol/L   Glucose, Bld 102 (H) 65 -  99 mg/dL   BUN 10 6 - 20 mg/dL   Creatinine, Ser 0.73 0.44 - 1.00 mg/dL   Calcium 8.5 (L) 8.9 - 10.3 mg/dL   Total Protein 6.7 6.5 - 8.1 g/dL   Albumin 3.8 3.5 - 5.0 g/dL   AST 18 15 - 41 U/L   ALT 12 (L) 14 - 54 U/L    Alkaline Phosphatase 36 (L) 38 - 126 U/L   Total Bilirubin 0.4 0.3 - 1.2 mg/dL   GFR calc non Af Amer >60 >60 mL/min   GFR calc Af Amer >60 >60 mL/min    Comment: (NOTE) The eGFR has been calculated using the CKD EPI equation. This calculation has not been validated in all clinical situations. eGFR's persistently <60 mL/min signify possible Chronic Kidney Disease.    Anion gap 7 5 - 15  Salicylate level     Status: None   Collection Time: 03/01/15 12:22 PM  Result Value Ref Range   Salicylate Lvl 35.5 2.8 - 30.0 mg/dL  Acetaminophen level     Status: None   Collection Time: 03/01/15 12:22 PM  Result Value Ref Range   Acetaminophen (Tylenol), Serum 20 10 - 30 ug/mL    Comment:        THERAPEUTIC CONCENTRATIONS VARY SIGNIFICANTLY. A RANGE OF 10-30 ug/mL MAY BE AN EFFECTIVE CONCENTRATION FOR MANY PATIENTS. HOWEVER, SOME ARE BEST TREATED AT CONCENTRATIONS OUTSIDE THIS RANGE. ACETAMINOPHEN CONCENTRATIONS >150 ug/mL AT 4 HOURS AFTER INGESTION AND >50 ug/mL AT 12 HOURS AFTER INGESTION ARE OFTEN ASSOCIATED WITH TOXIC REACTIONS.     Observation Level/Precautions:  15 minute checks  Laboratory:  CBC Chemistry Profile UDS UA  Psychotherapy:    Medications:    Consultations:    Discharge Concerns:    Estimated LOS:  Other:     Psychological Evaluations: No   Treatment Plan Summary: Daily contact with patient to assess and evaluate symptoms and progress in treatment and Medication management  Medical Decision Making:  New problem, with additional work up planned, Review of Psycho-Social Stressors (1), Review or order clinical lab tests (1), Review of Medication Regimen & Side Effects (2) and Review of New Medication or Change in Dosage (2)   Kathleen Gray is a 39 year old female with a history of depression admitted after a suicide attempt a medication overdose.  1. Suicidal ideation. The patient is able to contract for safety in the hospital.  2. Mood. She has been  maintained on Wellbutrin with Abilify added recently for augmentation. She did see Dr. Jimmye Norman for primary psychiatrist on the 17th. We will continue medications.  3. Anxiety. She is on low-dose clonazepam.  4. Insomnia. She is on Ambien.  5. Disposition. She will be discharged to home. Follow-up with Dr. Jimmye Norman.   I certify that inpatient services furnished can reasonably be expected to improve the patient's condition.   Kathleen Gray 8/2/20163:28 PM

## 2015-03-01 NOTE — ED Notes (Signed)

## 2015-03-01 NOTE — ED Notes (Signed)
Psych md with pt 

## 2015-03-01 NOTE — ED Provider Notes (Signed)
[facility] Emergency Department Provider Note  ____________________________________________  Time seen: Approximately 2352 PM  I have reviewed the triage vital signs and the nursing notes.   HISTORY  Chief Complaint Mental Health Problem    HPI Kathleen Gray is a 39 y.o. female who was brought in today with a drug overdose. The patient reports that she took a bunch of pills to include Klonopin, Ambien and over-the-counter sleep medication or Tylenol. The patient reports that she did not count them she just took 2 handfuls approximate 1700. She reports that she's been depressed for a long time and she was trying to kill herself. The patient reports that she was here admitted to the hospital in January. The patient reports that she did have a [employer] hard lemonade tonight. She also sat in the car trying to poison herself with carbon monoxide. The patient denies any homicidal ideation or hallucinations.   Past Medical History  Diagnosis Date  . Hypothyroidism   . Depression   . Anxiety     Patient Active Problem List   Diagnosis Date Noted  . Anxiety, generalized 01/14/2015  . H/O: hypothyroidism 01/14/2015  . Insomnia, persistent 01/14/2015  . Depression, major, recurrent, moderate 01/14/2015    Past Surgical History  Procedure Laterality Date  . Cosmetic surgery      Current Outpatient Rx  Name  Route  Sig  Dispense  Refill  . ARIPiprazole (ABILIFY) 5 MG tablet   Oral   Take 1 tablet (5 mg total) by mouth daily.   30 tablet   1   . buPROPion (WELLBUTRIN XL) 300 MG 24 hr tablet   Oral   Take 1 tablet (300 mg total) by mouth daily.   30 tablet   1   . clonazePAM (KLONOPIN) 1 MG tablet   Oral   Take 1 tablet (1 mg total) by mouth 2 (two) times daily.   60 tablet   1   . levothyroxine (SYNTHROID) 150 MCG tablet   Oral   Take by mouth.         . zolpidem (AMBIEN) 10 MG tablet   Oral   Take 1 tablet (10 mg total) by mouth at bedtime as  needed for sleep.   30 tablet   1     Allergies Review of patient's allergies indicates no known allergies.  No family history on file.  Social History History  Substance Use Topics  . Smoking status: Never Smoker   . Smokeless tobacco: Not on file  . Alcohol Use: 0.0 oz/week    0 Standard drinks or equivalent per week    Review of Systems Constitutional: No fever/chills Eyes: No visual changes. ENT: No sore throat. Cardiovascular: Denies chest pain. Respiratory: Denies shortness of breath. Gastrointestinal: No abdominal pain.  No nausea, no vomiting.  No diarrhea.  No constipation. Genitourinary: Negative for dysuria. Musculoskeletal: Negative for back pain. Skin: Negative for rash. Neurological: Negative for headaches, focal weakness or numbness.  10-point ROS otherwise negative.  ____________________________________________   PHYSICAL EXAM:  VITAL SIGNS: ED Triage Vitals  Enc Vitals Group     BP 02/28/15 2329 117/82 mmHg     Pulse Rate 02/28/15 2329 120     Resp 02/28/15 2329 18     Temp 02/28/15 2329 98.6 F (37 C)     Temp Source 02/28/15 2329 Oral     SpO2 02/28/15 2329 98 %     Weight 02/28/15 2329 150 lb (68.04 kg)  Height 02/28/15 2329 5\' 7"  (1.702 m)     Head Cir --      Peak Flow --      Pain Score 02/28/15 2329 0     Pain Loc --      Pain Edu? --      Excl. in GC? --     Constitutional: Alert and oriented. Well appearing and in no acute distress. Eyes: Conjunctivae are normal. PERRL. EOMI. Head: Atraumatic. Nose: No congestion/rhinnorhea. Mouth/Throat: Mucous membranes are moist.  Oropharynx non-erythematous. Cardiovascular: Normal rate, regular rhythm. Grossly normal heart sounds.  Good peripheral circulation. Respiratory: Normal respiratory effort.  No retractions. Lungs CTAB. Gastrointestinal: Soft and nontender. No distention. Positive bowel sounds Genitourinary: Deferred Musculoskeletal: No lower extremity tenderness nor edema.   No joint effusions. Neurologic:  Normal speech and language. No gross focal neurologic deficits are appreciated. No gait instability. Skin:  Skin is warm, dry and intact. No rash noted. Psychiatric: Mood and affect are normal.   ____________________________________________   LABS (all labs ordered are listed, but only abnormal results are displayed)  Labs Reviewed  COMPREHENSIVE METABOLIC PANEL - Abnormal; Notable for the following:    Potassium 3.4 (*)    CO2 18 (*)    Glucose, Bld 104 (*)    Total Bilirubin 0.2 (*)    All other components within normal limits  ETHANOL - Abnormal; Notable for the following:    Alcohol, Ethyl (B) 12 (*)    All other components within normal limits  SALICYLATE LEVEL - Abnormal; Notable for the following:    Salicylate Lvl 32.4 (*)    All other components within normal limits  ACETAMINOPHEN LEVEL - Abnormal; Notable for the following:    Acetaminophen (Tylenol), Serum 79 (*)    All other components within normal limits  CBC - Abnormal; Notable for the following:    WBC 12.2 (*)    All other components within normal limits  URINE DRUG SCREEN, QUALITATIVE ([facility] ONLY) - Abnormal; Notable for the following:    Benzodiazepine, Ur Scrn POSITIVE (*)    All other components within normal limits  BLOOD GAS, ARTERIAL - Abnormal; Notable for the following:    pH, Arterial 7.48 (*)    pCO2 arterial 25 (*)    pO2, Arterial 116 (*)    Bicarbonate 18.6 (*)    Acid-base deficit 3.3 (*)    All other components within normal limits  CARBOXYHEMOGLOBIN  COMPREHENSIVE METABOLIC PANEL  BLOOD GAS, ARTERIAL  POC URINE PREG, ED  POCT PREGNANCY, URINE   ____________________________________________  EKG  ED ECG REPORT I, Rebecka Apley, the attending physician, personally viewed and interpreted this ECG.   Date: 03/01/2015  EKG Time: 0015  Rate: 99  Rhythm: normal sinus rhythm  Axis: normal  Intervals:incomplete right bundle branch block  ST&T  Change: none  ____________________________________________  RADIOLOGY  None ____________________________________________   PROCEDURES  Procedure(s) performed: None  Critical Care performed: No  ____________________________________________   INITIAL IMPRESSION / ASSESSMENT AND PLAN / ED COURSE  Pertinent labs & imaging results that were available during my care of the patient were reviewed by me and considered in my medical decision making (see chart for details).  The patient is a 39 year old female who overdosed on medication today. The patient does have some mild elevation of her aspirin level as well as her Tylenol. We will do an ABG to determine if the patient needs any further evaluation.   The patient received a liter of normal  saline. The patient did have some elevation of her pH with some respiratory alkalosis and metabolic acidosis. She will receive a second liter of normal saline and have her ABG as well as her BMP repeated. The patient's Tylenol level is below the treatment level. We will reassess the patient and determine if she can be admitted to psych or medicine. ____________________________________________   FINAL CLINICAL IMPRESSION(S) / ED DIAGNOSES  Final diagnoses:  Drug overdose, intentional self-harm, initial encounter  Suicide attempt      Rebecka Apley, MD 03/01/15 0830

## 2015-03-01 NOTE — ED Notes (Signed)
BEHAVIORAL HEALTH ROUNDING Patient sleeping: No. Patient alert and oriented: yes Behavior appropriate: Yes.  ; If no, describe: appropriate Nutrition and fluids offered: Yes  Toileting and hygiene offered: Yes  Sitter present: q 15 min checks Law enforcement present: Yes

## 2015-03-01 NOTE — Plan of Care (Signed)
Problem: Alteration in mood Goal: LTG-Patient reports reduction in suicidal thoughts (Patient reports reduction in suicidal thoughts and is able to verbalize a safety plan for whenever patient is feeling suicidal)  Outcome: Progressing Denies suicidal ideations     

## 2015-03-01 NOTE — Tx Team (Signed)
Initial Interdisciplinary Treatment Plan   PATIENT STRESSORS: Loss of boyfriend Traumatic event   PATIENT STRENGTHS: Ability for insight Average or above average intelligence Communication skills Supportive family/friends   PROBLEM LIST: Problem List/Patient Goals Date to be addressed Date deferred Reason deferred Estimated date of resolution  Depression  03/01/15     Suicidal Attempt 03/01/15/                                                DISCHARGE CRITERIA:  Ability to meet basic life and health needs Adequate post-discharge living arrangements  PRELIMINARY DISCHARGE PLAN: Outpatient therapy Return to previous living arrangement  PATIENT/FAMIILY INVOLVEMENT: This treatment plan has been presented to and reviewed with the patient, Kathleen Gray, and/or family member,   The patient and family have been given the opportunity to ask questions and make suggestions.  Elan Brainerd A Ellyn Rubiano 03/01/2015, 5:53 PM

## 2015-03-01 NOTE — Progress Notes (Signed)
Admission Note:  D: Pt appeared depressed  With  a flat affect.  Pt  denies SI / AVH at this time. Wearing scrubs , but quickly took them off  ,wearing  T shirts that shirts that do not cover skin . Informed she would need to change clothes. Refused stayed close to her room . Patient admitted for suicidal overdose  On xanax, ambien,and klonopin also carbon monoxide from car.Previous suicidal attempt. Father died in 06/21/23 . Lost job . Has a history fof anxiety . Patient very angry and agitated on arrival . Every thing negative from her speaking. A: Pt admitted to unit per protocol, skin assessment and search done and no contraband found.  Pt  educated on therapeutic milieu rules. Pt was introduced to milieu by nursing staff.    R: Pt was receptive to education about the milieu .  15 min safety checks started. Clinical research associate offered support

## 2015-03-01 NOTE — BHH Suicide Risk Assessment (Signed)
[facility] Admission Suicide Risk Assessment   Nursing information obtained from:    Demographic factors:    Current Mental Status:    Loss Factors:    Historical Factors:    Risk Reduction Factors:    Total Time spent with patient: 1 hour Principal Problem: <principal problem not specified> Diagnosis:   Patient Active Problem List   Diagnosis Date Noted  . Anxiety, generalized [F41.1] 01/14/2015  . H/O: hypothyroidism [Z86.39] 01/14/2015  . Insomnia, persistent [G47.00] 01/14/2015  . Depression, major, recurrent, moderate [F33.1] 01/14/2015     Continued Clinical Symptoms:    The "Alcohol Use Disorders Identification Test", Guidelines for Use in Primary Care, Second Edition.  World Science writer [facility]). Score between 0-7:  no or low risk or alcohol related problems. Score between 8-15:  moderate risk of alcohol related problems. Score between 16-19:  high risk of alcohol related problems. Score 20 or above:  warrants further diagnostic evaluation for alcohol dependence and treatment.   CLINICAL FACTORS:   Depression:   Severe   Musculoskeletal: Strength & Muscle Tone: within normal limits Gait & Station: normal Patient leans: N/A  Psychiatric Specialty Exam: I reviewed physical exam performed in the emergency room and concur with its findings. Physical Exam  Nursing note and vitals reviewed.   Review of Systems  All other systems reviewed and are negative.   Last menstrual period 02/28/2015.There is no weight on file to calculate BMI.  General Appearance: Casual  Eye Contact::  Poor  Speech:  Slow  Volume:  Decreased  Mood:  Depressed  Affect:  Blunt  Thought Process:  Goal Directed  Orientation:  Full (Time, Place, and Person)  Thought Content:  WDL  Suicidal Thoughts:  Yes.  with intent/plan  Homicidal Thoughts:  No  Memory:  Immediate;   Fair Recent;   Fair Remote;   Fair  Judgement:  Fair  Insight:  Fair  Psychomotor Activity:  Normal  Concentration:   Fair  Recall:  [employer] of Knowledge:Fair  Language: Fair  Akathisia:  No  Handed:  Right  AIMS (if indicated):     Assets:  Communication Skills Desire for Improvement Financial Resources/Insurance Housing Physical Health Social Support  Sleep:     Cognition: WNL  ADL's:  Intact     COGNITIVE FEATURES THAT CONTRIBUTE TO RISK:  None    SUICIDE RISK:   Moderate:  Frequent suicidal ideation with limited intensity, and duration, some specificity in terms of plans, no associated intent, good self-control, limited dysphoria/symptomatology, some risk factors present, and identifiable protective factors, including available and accessible social support.  PLAN OF CARE: Hospital admission, medication management, discharge planning.  Medical Decision Making:  New problem, with additional work up planned, Review of Psycho-Social Stressors (1), Review or order clinical lab tests (1), Review of Medication Regimen & Side Effects (2) and Review of New Medication or Change in Dosage (2)   Kathleen Gray is a 39 year old female with a history of depression admitted after a suicide attempt a medication overdose.  1. Suicidal ideation. The patient is able to contract for safety in the hospital.  2. Mood. She has been maintained on Wellbutrin with Abilify added recently for augmentation. She did see Dr. Mayford Knife for primary psychiatrist on the 17th. We will continue medications.  3. Anxiety. She is on low-dose clonazepam.  4. Insomnia. She is on Ambien.  5. Disposition. She will be discharged to home. Follow-up with Dr. Mayford Knife.  I certify that inpatient services furnished can  reasonably be expected to improve the patient's condition.   Kathleen Gray 03/01/2015, 3:19 PM

## 2015-03-01 NOTE — ED Notes (Signed)
ED BHU PLACEMENT JUSTIFICATION Is the patient under IVC or is there intent for IVC: Yes.   Is the patient medically cleared: No. Is there vacancy in the ED BHU: Yes.   Is the population mix appropriate for patient: Yes.   Is the patient awaiting placement in inpatient or outpatient setting: No. Has the patient had a psychiatric consult: No. Survey of unit performed for contraband, proper placement and condition of furniture, tampering with fixtures in bathroom, shower, and each patient room: Yes.  ; Findings:  APPEARANCE/BEHAVIOR calm NEURO ASSESSMENT Orientation: time, place and person Hallucinations: No.None noted (Hallucinations) Speech: Normal Gait: normal RESPIRATORY ASSESSMENT Normal expansion.  Clear to auscultation.  No rales, rhonchi, or wheezing. CARDIOVASCULAR ASSESSMENT regular rate and rhythm, S1, S2 normal, no murmur, click, rub or gallop GASTROINTESTINAL ASSESSMENT soft, nontender, BS WNL, no r/g EXTREMITIES normal strength, tone, and muscle mass PLAN OF CARE Provide calm/safe environment. Vital signs assessed twice daily. ED BHU Assessment once each 12-hour shift. Collaborate with intake RN daily or as condition indicates. Assure the ED provider has rounded once each shift. Provide and encourage hygiene. Provide redirection as needed. Assess for escalating behavior; address immediately and inform ED provider.  Assess family dynamic and appropriateness for visitation as needed: Yes.  ; If necessary, describe findings:  Educate the patient/family about BHU procedures/visitation: Yes.  ; If necessary, describe findings:
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
## 2015-03-01 NOTE — ED Notes (Signed)
BEHAVIORAL HEALTH ROUNDING Patient sleeping: Yes.   Patient alert and oriented: yes Behavior appropriate: Yes.  ; If no, describe:  Nutrition and fluids offered: Yes  Toileting and hygiene offered: Yes  Sitter present: no Law enforcement present: Yes  

## 2015-03-02 MED ORDER — FLUCONAZOLE 50 MG PO TABS
150.0000 mg | ORAL_TABLET | Freq: Once | ORAL | Status: AC
  Administered 2015-03-02: 150 mg via ORAL
  Filled 2015-03-02: qty 3

## 2015-03-02 NOTE — Plan of Care (Signed)
Problem: Alteration in mood Goal: LTG-Patient reports reduction in suicidal thoughts (Patient reports reduction in suicidal thoughts and is able to verbalize a safety plan for whenever patient is feeling suicidal)  Outcome: Progressing Client denies having any suicidal thoughts today; Client presents with less irritability; more cooperative; stating that, she wants to go home; and is afraid of losing her job; while being in the hospital.

## 2015-03-02 NOTE — [facility] Group Notes (Signed)
[facility] LCSW Group Therapy  03/02/2015 2:56 PM  Type of Therapy:  Group Therapy  Participation Level:  Active  Participation Quality:  Monopolizing  Affect:  Defensive  Cognitive:  Appropriate  Insight:  Monopolizing  Engagement in Therapy:  Monopolizing  Modes of Intervention:  Confrontation, Discussion and Support  Summary of Progress/Problems:LCSW discussed group rules. First interruption this patient was rude to her peers stating she does not belong in this hospital with THESE patients. She offended 2 group members, then proceeded to trash staff and peers. She was redirected and confronted as well. She reported she was angry that she ended up in the hospital and was not successful in completing suicide. LCSW explained that Im sure her daughter was happy she was alive... Patient stated they only found me because of my cell phone.( Patient lacks insight of her actions) LCSW reminded patient that group was not the forum to air her upset. She was reminded to fill out her survey. In the end of group she was engaging with peers too much and tried to be the therapist. She was redirected and reminded to focus on her own wellness and care plan not others.  Arrie Senate M 03/02/2015, 2:56 PM
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
## 2015-03-02 NOTE — [facility] Suicide Risk Assessment (Signed)
[facility] Discharge Suicide Risk Assessment   Demographic Factors:  Divorced or widowed and Caucasian  Total Time spent with patient: 30 minutes  Musculoskeletal: Strength & Muscle Tone: within normal limits Gait & Station: normal Patient leans: N/A  Psychiatric Specialty Exam: Physical Exam  Nursing note and vitals reviewed.   Review of Systems  All other systems reviewed and are negative.   Blood pressure 109/77, pulse 65, temperature 98.6 F (37 C), temperature source Oral, resp. rate 18, height  (1.702 m), weight 63.504 kg (140 lb), last menstrual period 02/28/2015.Body mass index is 21.92 kg/(m^2).  General Appearance: Casual  Eye Contact::  Good  Speech:  Clear and Coherent409  Volume:  Normal  Mood:  Euthymic  Affect:  Appropriate  Thought Process:  Goal Directed  Orientation:  Full (Time, Place, and Person)  Thought Content:  WDL  Suicidal Thoughts:  No  Homicidal Thoughts:  No  Memory:  Immediate;   Fair Recent;   Fair Remote;   Fair  Judgement:  Fair  Insight:  Fair  Psychomotor Activity:  Normal  Concentration:  Fair  Recall:  [employer] of Knowledge:Fair  Language: Fair  Akathisia:  No  Handed:  Right  AIMS (if indicated):     Assets:  Communication Skills Desire for Improvement Financial Resources/Insurance Housing Physical Health Social Support  Sleep:  Number of Hours: 7.75  Cognition: WNL  ADL's:  Intact   Have you used any form of tobacco in the last 30 days? (Cigarettes, Smokeless Tobacco, Cigars, and/or Pipes): No  Has this patient used any form of tobacco in the last 30 days? (Cigarettes, Smokeless Tobacco, Cigars, and/or Pipes) No  Mental Status Per Nursing Assessment::   On Admission:     Current Mental Status by Physician: NA  Loss Factors: Loss of significant relationship  Historical Factors: NA  Risk Reduction Factors:   Responsible for children under 38 years of age, Sense of responsibility to family, Religious beliefs  about death, Living with another person, especially a relative, Positive social support and Positive therapeutic relationship  Continued Clinical Symptoms:  Depression:   Severe  Cognitive Features That Contribute To Risk:  None    Suicide Risk:  Minimal: No identifiable suicidal ideation.  Patients presenting with no risk factors but with morbid ruminations; may be classified as minimal risk based on the severity of the depressive symptoms  Principal Problem: Major depressive disorder, recurrent severe without psychotic features Discharge Diagnoses:  Patient Active Problem List   Diagnosis Date Noted  . Major depressive disorder, recurrent severe without psychotic features [F33.2] 03/01/2015  . Anxiety, generalized [F41.1] 01/14/2015  . H/O: hypothyroidism [Z86.39] 01/14/2015  . Insomnia, persistent [G47.00] 01/14/2015  . Depression, major, recurrent, moderate [F33.1] 01/14/2015      Plan Of Care/Follow-up recommendations:  Activity:  As tolerated. Diet:  Regular. Other:  Keep follow-up appointments.  Is patient on multiple antipsychotic therapies at discharge:  No   Has Patient had three or more failed trials of antipsychotic monotherapy by history:  No  Recommended Plan for Multiple Antipsychotic Therapies: NA    Jolanta Pucilowska 03/02/2015, 2:33 PM

## 2015-03-02 NOTE — Plan of Care (Signed)
Problem: Alteration in mood Goal: LTG-Pt's behavior demonstrates decreased signs of depression (Patient's behavior demonstrates decreased signs of depression to the point the patient is safe to return home and continue treatment in an outpatient setting)  Outcome: Progressing Patient is interacting appropriately with peers and staff and attending unit program.

## 2015-03-02 NOTE — [facility] Group Notes (Signed)
[facility] Group Notes:  (Nursing/MHT/Case Management/Adjunct)  Date:  03/02/2015  Time:  1:25 PM  Type of Therapy:  Psychoeducational Skills  Participation Level:  Did Not Attend   [name] [facility] 03/02/2015, 1:25 PM
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
## 2015-03-02 NOTE — Progress Notes (Addendum)
D: Pt denies SI/HI/AVH. Pt is pleasant and cooperative, but affect is flat and depressed. Pt expressed frustration that she is placed in the behavioral unit. She appears restless and stated that she needs ambien for insomnia, she is interacting with peers and staff appropriately.  A: Pt was offered support and encouragement. Pt was given scheduled medications. Pt was encouraged to attend groups. Q 15 minute checks were done for safety.  R: Patient attends group and interacts well with peers and staff. Pt is taking medication. Encouraged patients to verbalize concerns, safety maintained on unit.

## 2015-03-02 NOTE — [facility] Group Notes (Signed)
[name] LCSW Aftercare Discharge Planning Group Note  03/02/2015 3:03 PM  Participation Quality:  Monopolizing  Affect:  Defensive  Cognitive:  Appropriate  Insight:  Distracting  Engagement in Group:  Distracting  Modes of Intervention:  Discussion and Education  Summary of Progress/Problems: Patient goal " Get out of this F!#$% ing hospital"  Arrie Senate M 03/02/2015, 3:03 PM

## 2015-03-02 NOTE — Progress Notes (Signed)
Client has been discharged with all belongings were given; denies having any suicidal thoughts with stating, "no"; the discharge process has been explained with follow up. Client has also received a return to work letter the MD. Client left the unit at 16:20; accompanied by her daughter.

## 2015-03-03 NOTE — Progress Notes (Signed)
AVS H&P Discharge Summary faxed to [facility] for hospital follow-up

## 2015-03-11 ENCOUNTER — Ambulatory Visit (INDEPENDENT_AMBULATORY_CARE_PROVIDER_SITE_OTHER): Payer: Medicaid Other | Admitting: Psychiatry

## 2015-03-11 ENCOUNTER — Encounter: Payer: Self-pay | Admitting: Psychiatry

## 2015-03-11 ENCOUNTER — Ambulatory Visit (INDEPENDENT_AMBULATORY_CARE_PROVIDER_SITE_OTHER): Payer: Medicaid Other | Admitting: Licensed Clinical Social Worker

## 2015-03-11 VITALS — BP 108/78 | HR 74 | Temp 97.3°F | Ht 67.0 in | Wt 146.4 lb

## 2015-03-11 DIAGNOSIS — F332 Major depressive disorder, recurrent severe without psychotic features: Secondary | ICD-10-CM

## 2015-03-11 DIAGNOSIS — F4310 Post-traumatic stress disorder, unspecified: Secondary | ICD-10-CM | POA: Diagnosis not present
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
## 2015-03-14 ENCOUNTER — Telehealth: Payer: Self-pay

## 2015-03-14 DIAGNOSIS — F332 Major depressive disorder, recurrent severe without psychotic features: Secondary | ICD-10-CM | POA: Insufficient documentation

## 2015-03-14 DIAGNOSIS — F4310 Post-traumatic stress disorder, unspecified: Secondary | ICD-10-CM | POA: Insufficient documentation

## 2015-03-14 MED ORDER — BUPROPION HCL ER (XL) 150 MG PO TB24: 450.0000 mg | ORAL_TABLET | Freq: Every day | ORAL | Status: DC

## 2015-03-14 MED ORDER — TRAZODONE HCL 50 MG PO TABS: ORAL_TABLET | ORAL | Status: DC
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
## 2015-03-14 NOTE — Telephone Encounter (Signed)
pt states she seen you on friday. pt states that [facility] still has not received the rx's that you were going to send in for her.  pt states it was suppose to be the trazodon and wellbutrin

## 2015-03-18 ENCOUNTER — Ambulatory Visit (INDEPENDENT_AMBULATORY_CARE_PROVIDER_SITE_OTHER): Payer: Medicaid Other | Admitting: Licensed Clinical Social Worker

## 2015-03-18 DIAGNOSIS — F4310 Post-traumatic stress disorder, unspecified: Secondary | ICD-10-CM | POA: Diagnosis not present

## 2015-03-18 DIAGNOSIS — F332 Major depressive disorder, recurrent severe without psychotic features: Secondary | ICD-10-CM

## 2015-03-18 NOTE — Progress Notes (Signed)
THERAPIST PROGRESS NOTE  Session Time: 11:10 a.m.- 12:30 p.m.  Participation Level: Active  Behavioral Response: NeatAlertAnxious and Depressed  Type of Therapy: Individual Therapy  Treatment Goals addressed: Anxiety and Coping  Interventions: CBT, Strength-based, Psychosocial Skills: Distress tolerance, Supportive and Reframing  Summary: Kathleen Gray is a 39 y.o. female who returns to OPT today with ongoing anxiety symptoms that often result in her leaving a store or social situation to go home and depressive symptoms reported to be improving. Kathleen Gray denied having a bad experience or feeling "ganged up on" during last week visit where both LCSW and Dr. Mayford Knife spent time discussing her treatment/care options.  "No. I felt like you genuinely cared about me."  Primary symptoms that Kathleen Gray reported experiencing are related to anxiety.  She described her experience with anxiety as: "My stomach feels quezzy and I can't be in a crowd."  She recently was able to help with and attend a baby shower for her daughter's friend.  "Different times of the day I'll start thinking about things."  One area of stress is still being concerned about boyfriend deciding that he can't deal with her and previous lies that client admits to keeping from him then deciding to break up with her. Kathleen Gray with fair insight stating "I can't control how he feels."  Thoughts will ruminate about whether or not he will leave her yet when gently challenged to examine her thoughts she confirmed that she did not have any proof of this. The two are talking and no reported arguments.  She was receptive to in session exercise to identify and re-frame negative and self-defeating thoughts.  Kathleen Gray reports that "I've been praying a lot and that helps."  Previous session and hand-outs have been helpful per client who shared with LCSW "I am forgiveable and I am lovable. I told Kathleen Gray everything and I don't have anymore secrets."  In addition, she  finds that deep breathing, exercise and use of a daily Bible app on her phone are useful when she begins to think too much or feel bad.  Kathleen Gray voiced understanding of goal to learn how to sit with negative experiences, thoughts and emotions rather than try to do something to prevent them or push them away.    Overall, Kathleen Gray stated: "I'm better. I haven't had any crying spells." Does state that her depression is improving and is satisfied with current anti-depressant. She has decided not to return to work at the [employer] and has an interview today with a Psychologist, educational.  Sleep is good but feels sedated upon waking and has this feeling through lunch time.  She takes Trazadone around 8-9 p.m.and is usually asleep within 30 minutes. Again, she sleeps through the night and up around 8:00 a.m. yet feeling sluggish. Kathleen Gray demonstrating that she can challenge negative beliefs about herself and is thinking more positively about herself.   She voiced concern about the emotional health of her boyfriend's twins, age 54, and glad that they will start counseling today.  They enjoyed a day tubing on the [facility].  Additional concerns about daughter's being diagnosed with PCOS and unlikely able to have children.  Some concern about son who does not leave home much other than to go to work.  Kathleen Gray talked about pride in her children and sorrow for her daughter yet has been encouraging daughter to stay optimistic about future treatment options.  Kathleen Gray has not discussed her suicide attempts with her children stating "I just want to  forget about it and move on."  Denied that her children have asked her about depression/anxiety or attempts and informed LCSW "I wouldn't know what to say to them."  At this time she seems hesitant to talk to either her boyfriend or her children about what has happened and what they can do to help her in the future to prevent additional suicide attempts.  Moving forward Kathleen Gray feels  that it's helping to work on the coping card and understanding more about how to push through self-criticism to avoid becoming emotionally over-whelmed with negative and irrational fears and beliefs.   Suicidal/Homicidal: Negativewithout intent/plan  Denied today, mood is better per client's report and she is using positive activities to distract her negative and self defeating thoughts.  Therapist Response:   Ongoing emotional and social support provided.  Discussed basis for fear of being alone and recommended that she consider starting a conversation with her children and boyfriend about what triggers her or leads to her SI . Commended on available resources such as her faith, exercise and use of affirmations.  Engaged Kathleen Gray in writing a coping card and also provided hand outs on tips for talking self from distress to being calm.  Assisted her to recognize common self-defeating thoughts and statements that she can use to calm self and decrease self-criticism and fear of being alone.  Explained that LCSW is awaiting contact with [facility] about client's referral to ACT or CST.  Reviewed the services again and completed release of information with client. Normalized her hesitancy to talk about her suicide attempts while also empowering her to stay open to the opportunities that this can have to strengthen relationship between she and family.   Encouraged calls between sessions PRN.  Plan: Return again in one week. Coordinate referral for enhanced services.  Continue assessing and monitoring client's SI and at risk behaviors.    Diagnosis: Major Depressive Disorder, Recurrent, Severe, Without Psychosis   PTSD   Kathleen Gray Jan, LCSW 03/18/2015

## 2015-03-25 ENCOUNTER — Ambulatory Visit: Payer: Self-pay | Admitting: Psychiatry

## 2015-03-30 ENCOUNTER — Telehealth: Payer: Self-pay | Admitting: Licensed Clinical Social Worker

## 2015-03-30 NOTE — Telephone Encounter (Signed)
Left message for Kathleen Gray to notify that she has an appointment on 04/01/15, at 1:00 p.m. With LCSW.  Requested call back.  Support offered.

## 2015-03-30 NOTE — Telephone Encounter (Signed)
Call from [employer] with [facility] [phone]) who has tried several times to contact client to schedule an intake appointment for CST services. The number is not in service therefore client has not been reached at this time. LCSW will f/u with clt.

## 2015-04-01 ENCOUNTER — Ambulatory Visit (INDEPENDENT_AMBULATORY_CARE_PROVIDER_SITE_OTHER): Payer: Medicaid Other | Admitting: Licensed Clinical Social Worker

## 2015-04-01 DIAGNOSIS — F332 Major depressive disorder, recurrent severe without psychotic features: Secondary | ICD-10-CM

## 2015-04-01 DIAGNOSIS — F4310 Post-traumatic stress disorder, unspecified: Secondary | ICD-10-CM
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
## 2015-04-05 ENCOUNTER — Ambulatory Visit: Payer: Self-pay | Admitting: Psychiatry

## 2015-04-08 ENCOUNTER — Ambulatory Visit: Payer: Self-pay | Admitting: Psychiatry

## 2015-04-08 ENCOUNTER — Ambulatory Visit: Payer: Self-pay | Admitting: Licensed Clinical Social Worker

## 2015-05-06 ENCOUNTER — Inpatient Hospital Stay
Admission: EM | Admit: 2015-05-06 | Discharge: 2015-05-31 | DRG: 917 | Disposition: E | Payer: Medicaid Other | Attending: Internal Medicine | Admitting: Internal Medicine

## 2015-05-06 ENCOUNTER — Other Ambulatory Visit: Payer: Self-pay

## 2015-05-06 ENCOUNTER — Emergency Department: Payer: Medicaid Other

## 2015-05-06 DIAGNOSIS — E039 Hypothyroidism, unspecified: Secondary | ICD-10-CM | POA: Diagnosis present

## 2015-05-06 DIAGNOSIS — Z915 Personal history of self-harm: Secondary | ICD-10-CM | POA: Diagnosis not present

## 2015-05-06 DIAGNOSIS — Y95 Nosocomial condition: Secondary | ICD-10-CM | POA: Diagnosis present

## 2015-05-06 DIAGNOSIS — R740 Nonspecific elevation of levels of transaminase and lactic acid dehydrogenase [LDH]: Secondary | ICD-10-CM | POA: Diagnosis not present

## 2015-05-06 DIAGNOSIS — E876 Hypokalemia: Secondary | ICD-10-CM | POA: Diagnosis present

## 2015-05-06 DIAGNOSIS — R569 Unspecified convulsions: Secondary | ICD-10-CM

## 2015-05-06 DIAGNOSIS — E282 Polycystic ovarian syndrome: Secondary | ICD-10-CM | POA: Diagnosis present

## 2015-05-06 DIAGNOSIS — R4182 Altered mental status, unspecified: Secondary | ICD-10-CM | POA: Diagnosis present

## 2015-05-06 DIAGNOSIS — D72829 Elevated white blood cell count, unspecified: Secondary | ICD-10-CM | POA: Diagnosis present

## 2015-05-06 DIAGNOSIS — J810 Acute pulmonary edema: Secondary | ICD-10-CM | POA: Diagnosis not present

## 2015-05-06 DIAGNOSIS — I5023 Acute on chronic systolic (congestive) heart failure: Secondary | ICD-10-CM | POA: Diagnosis not present

## 2015-05-06 DIAGNOSIS — J9690 Respiratory failure, unspecified, unspecified whether with hypoxia or hypercapnia: Secondary | ICD-10-CM | POA: Diagnosis not present

## 2015-05-06 DIAGNOSIS — G931 Anoxic brain damage, not elsewhere classified: Secondary | ICD-10-CM

## 2015-05-06 DIAGNOSIS — F419 Anxiety disorder, unspecified: Secondary | ICD-10-CM | POA: Diagnosis present

## 2015-05-06 DIAGNOSIS — Z811 Family history of alcohol abuse and dependence: Secondary | ICD-10-CM

## 2015-05-06 DIAGNOSIS — J9601 Acute respiratory failure with hypoxia: Secondary | ICD-10-CM | POA: Diagnosis present

## 2015-05-06 DIAGNOSIS — Z01818 Encounter for other preprocedural examination: Secondary | ICD-10-CM

## 2015-05-06 DIAGNOSIS — Z452 Encounter for adjustment and management of vascular access device: Secondary | ICD-10-CM

## 2015-05-06 DIAGNOSIS — G9341 Metabolic encephalopathy: Secondary | ICD-10-CM | POA: Diagnosis not present

## 2015-05-06 DIAGNOSIS — F329 Major depressive disorder, single episode, unspecified: Secondary | ICD-10-CM | POA: Diagnosis present

## 2015-05-06 DIAGNOSIS — T17990A Other foreign object in respiratory tract, part unspecified in causing asphyxiation, initial encounter: Secondary | ICD-10-CM | POA: Diagnosis present

## 2015-05-06 DIAGNOSIS — E872 Acidosis: Secondary | ICD-10-CM | POA: Diagnosis not present

## 2015-05-06 DIAGNOSIS — I639 Cerebral infarction, unspecified: Secondary | ICD-10-CM

## 2015-05-06 DIAGNOSIS — Y929 Unspecified place or not applicable: Secondary | ICD-10-CM | POA: Diagnosis not present

## 2015-05-06 DIAGNOSIS — R0902 Hypoxemia: Secondary | ICD-10-CM

## 2015-05-06 DIAGNOSIS — R570 Cardiogenic shock: Secondary | ICD-10-CM | POA: Diagnosis present

## 2015-05-06 DIAGNOSIS — T50901A Poisoning by unspecified drugs, medicaments and biological substances, accidental (unintentional), initial encounter: Secondary | ICD-10-CM

## 2015-05-06 DIAGNOSIS — Z0189 Encounter for other specified special examinations: Secondary | ICD-10-CM

## 2015-05-06 DIAGNOSIS — K7200 Acute and subacute hepatic failure without coma: Secondary | ICD-10-CM | POA: Diagnosis present

## 2015-05-06 DIAGNOSIS — T43292A Poisoning by other antidepressants, intentional self-harm, initial encounter: Principal | ICD-10-CM | POA: Diagnosis present

## 2015-05-06 DIAGNOSIS — R001 Bradycardia, unspecified: Secondary | ICD-10-CM | POA: Diagnosis present

## 2015-05-06 DIAGNOSIS — F4310 Post-traumatic stress disorder, unspecified: Secondary | ICD-10-CM | POA: Diagnosis present

## 2015-05-06 DIAGNOSIS — T50904S Poisoning by unspecified drugs, medicaments and biological substances, undetermined, sequela: Secondary | ICD-10-CM | POA: Diagnosis not present

## 2015-05-06 DIAGNOSIS — M6282 Rhabdomyolysis: Secondary | ICD-10-CM | POA: Diagnosis not present

## 2015-05-06 DIAGNOSIS — I469 Cardiac arrest, cause unspecified: Secondary | ICD-10-CM | POA: Diagnosis not present

## 2015-05-06 DIAGNOSIS — R0600 Dyspnea, unspecified: Secondary | ICD-10-CM

## 2015-05-06 DIAGNOSIS — Z833 Family history of diabetes mellitus: Secondary | ICD-10-CM | POA: Diagnosis not present

## 2015-05-06 DIAGNOSIS — T50902A Poisoning by unspecified drugs, medicaments and biological substances, intentional self-harm, initial encounter: Secondary | ICD-10-CM

## 2015-05-06 DIAGNOSIS — I472 Ventricular tachycardia: Secondary | ICD-10-CM | POA: Diagnosis not present

## 2015-05-06 DIAGNOSIS — J939 Pneumothorax, unspecified: Secondary | ICD-10-CM

## 2015-05-06 DIAGNOSIS — T50902S Poisoning by unspecified drugs, medicaments and biological substances, intentional self-harm, sequela: Secondary | ICD-10-CM | POA: Diagnosis not present

## 2015-05-06 DIAGNOSIS — Z515 Encounter for palliative care: Secondary | ICD-10-CM | POA: Diagnosis present

## 2015-05-06 DIAGNOSIS — B449 Aspergillosis, unspecified: Secondary | ICD-10-CM | POA: Diagnosis not present

## 2015-05-06 DIAGNOSIS — J189 Pneumonia, unspecified organism: Secondary | ICD-10-CM | POA: Diagnosis not present

## 2015-05-06 DIAGNOSIS — J690 Pneumonitis due to inhalation of food and vomit: Secondary | ICD-10-CM | POA: Diagnosis not present

## 2015-05-06 DIAGNOSIS — E870 Hyperosmolality and hypernatremia: Secondary | ICD-10-CM | POA: Diagnosis not present

## 2015-05-06 DIAGNOSIS — Z8249 Family history of ischemic heart disease and other diseases of the circulatory system: Secondary | ICD-10-CM

## 2015-05-06 DIAGNOSIS — I427 Cardiomyopathy due to drug and external agent: Secondary | ICD-10-CM | POA: Diagnosis present

## 2015-05-06 DIAGNOSIS — Z66 Do not resuscitate: Secondary | ICD-10-CM | POA: Diagnosis present

## 2015-05-06 DIAGNOSIS — G40901 Epilepsy, unspecified, not intractable, with status epilepticus: Secondary | ICD-10-CM | POA: Diagnosis present

## 2015-05-06 DIAGNOSIS — T1491 Suicide attempt: Secondary | ICD-10-CM | POA: Diagnosis not present

## 2015-05-06 DIAGNOSIS — J80 Acute respiratory distress syndrome: Secondary | ICD-10-CM | POA: Diagnosis not present

## 2015-05-06 DIAGNOSIS — D649 Anemia, unspecified: Secondary | ICD-10-CM | POA: Diagnosis present

## 2015-05-06 DIAGNOSIS — Z4659 Encounter for fitting and adjustment of other gastrointestinal appliance and device: Secondary | ICD-10-CM

## 2015-05-06 DIAGNOSIS — R197 Diarrhea, unspecified: Secondary | ICD-10-CM | POA: Diagnosis not present

## 2015-05-06 LAB — URINALYSIS COMPLETE WITH MICROSCOPIC (ARMC ONLY)
Bilirubin Urine: NEGATIVE
GLUCOSE, UA: NEGATIVE mg/dL
Hgb urine dipstick: NEGATIVE
Leukocytes, UA: NEGATIVE
Nitrite: NEGATIVE
PH: 5 (ref 5.0–8.0)
Protein, ur: NEGATIVE mg/dL
SQUAMOUS EPITHELIAL / LPF: NONE SEEN
Specific Gravity, Urine: 1.017 (ref 1.005–1.030)

## 2015-05-06 LAB — CBC WITH DIFFERENTIAL/PLATELET
Basophils Absolute: 0.1 10*3/uL (ref 0–0.1)
Basophils Relative: 1 %
EOS ABS: 0.3 10*3/uL (ref 0–0.7)
Eosinophils Relative: 3 %
HCT: 43.3 % (ref 35.0–47.0)
HEMOGLOBIN: 14.2 g/dL (ref 12.0–16.0)
Lymphocytes Relative: 25 %
Lymphs Abs: 2.6 10*3/uL (ref 1.0–3.6)
MCH: 28.5 pg (ref 26.0–34.0)
MCHC: 32.8 g/dL (ref 32.0–36.0)
MCV: 87.1 fL (ref 80.0–100.0)
MONO ABS: 0.7 10*3/uL (ref 0.2–0.9)
MONOS PCT: 6 %
Neutro Abs: 6.7 10*3/uL — ABNORMAL HIGH (ref 1.4–6.5)
Neutrophils Relative %: 65 %
Platelets: 298 10*3/uL (ref 150–440)
RBC: 4.97 MIL/uL (ref 3.80–5.20)
RDW: 13.1 % (ref 11.5–14.5)
WBC: 10.4 10*3/uL (ref 3.6–11.0)

## 2015-05-06 LAB — COMPREHENSIVE METABOLIC PANEL
ALK PHOS: 51 U/L (ref 38–126)
ALT: 30 U/L (ref 14–54)
AST: 30 U/L (ref 15–41)
Albumin: 4.3 g/dL (ref 3.5–5.0)
Anion gap: 13 (ref 5–15)
BILIRUBIN TOTAL: 1 mg/dL (ref 0.3–1.2)
BUN: 12 mg/dL (ref 6–20)
CALCIUM: 8.6 mg/dL — AB (ref 8.9–10.3)
CO2: 19 mmol/L — ABNORMAL LOW (ref 22–32)
CREATININE: 0.86 mg/dL (ref 0.44–1.00)
Chloride: 111 mmol/L (ref 101–111)
GFR calc Af Amer: >60 mL/min (ref >60)
Glucose, Bld: 114 mg/dL — ABNORMAL HIGH (ref 65–99)
Potassium: 2.9 mmol/L — CL (ref 3.5–5.1)
Sodium: 143 mmol/L (ref 135–145)
TOTAL PROTEIN: 7 g/dL (ref 6.5–8.1)

## 2015-05-06 LAB — BLOOD GAS, ARTERIAL
Acid-base deficit: 3.6 mmol/L — ABNORMAL HIGH (ref 0.0–2.0)
Bicarbonate: 20.6 mEq/L — ABNORMAL LOW (ref 21.0–28.0)
FIO2: 0.4
MECHANICAL RATE: 20
MECHVT: 450 mL
O2 SAT: 99.3 %
PATIENT TEMPERATURE: 37
PCO2 ART: 34 mmHg (ref 32.0–48.0)
PEEP: 5 cmH2O
PO2 ART: 153 mmHg — AB (ref 83.0–108.0)
pH, Arterial: 7.39 (ref 7.350–7.450)

## 2015-05-06 LAB — URINE DRUG SCREEN, QUALITATIVE (ARMC ONLY)
Amphetamines, Ur Screen: NOT DETECTED
BARBITURATES, UR SCREEN: NOT DETECTED
Benzodiazepine, Ur Scrn: POSITIVE — AB
Cannabinoid 50 Ng, Ur ~~LOC~~: NOT DETECTED
Cocaine Metabolite,Ur ~~LOC~~: NOT DETECTED
MDMA (Ecstasy)Ur Screen: NOT DETECTED
METHADONE SCREEN, URINE: NOT DETECTED
Opiate, Ur Screen: NOT DETECTED
Phencyclidine (PCP) Ur S: POSITIVE — AB
TRICYCLIC, UR SCREEN: NOT DETECTED

## 2015-05-06 LAB — SALICYLATE LEVEL: Salicylate Lvl: <4 mg/dL (ref 2.8–30.0)

## 2015-05-06 LAB — ETHANOL: Alcohol, Ethyl (B): 6 mg/dL — ABNORMAL HIGH (ref <5)

## 2015-05-06 LAB — ACETAMINOPHEN LEVEL: Acetaminophen (Tylenol), Serum: <10 ug/mL — ABNORMAL LOW (ref 10–30)

## 2015-05-06 LAB — PREGNANCY, URINE: PREG TEST UR: NEGATIVE

## 2015-05-06 LAB — LIPASE, BLOOD: Lipase: 25 U/L (ref 22–51)

## 2015-05-06 LAB — MAGNESIUM: MAGNESIUM: 2 mg/dL (ref 1.7–2.4)

## 2015-05-06 MED ORDER — POTASSIUM CHLORIDE IN NACL 40-0.9 MEQ/L-% IV SOLN
INTRAVENOUS | Status: AC
  Administered 2015-05-06: 75 mL/h via INTRAVENOUS
  Filled 2015-05-06: qty 1000

## 2015-05-06 MED ORDER — ROCURONIUM BROMIDE 50 MG/5ML IV SOLN
70.0000 mg | Freq: Once | INTRAVENOUS | Status: AC
  Administered 2015-05-06: 70 mg via INTRAVENOUS

## 2015-05-06 MED ORDER — LORAZEPAM 2 MG/ML IJ SOLN
INTRAMUSCULAR | Status: AC
  Administered 2015-05-06: 2 mg via INTRAVENOUS
  Filled 2015-05-06: qty 1

## 2015-05-06 MED ORDER — PROPOFOL 1000 MG/100ML IV EMUL
INTRAVENOUS | Status: AC
  Administered 2015-05-06: 10.291 ug/kg/min via INTRAVENOUS
  Filled 2015-05-06: qty 100

## 2015-05-06 MED ORDER — ETOMIDATE 2 MG/ML IV SOLN
20.0000 mg | Freq: Once | INTRAVENOUS | Status: AC
  Administered 2015-05-06: 20 mg via INTRAVENOUS

## 2015-05-06 MED ORDER — ENOXAPARIN SODIUM 40 MG/0.4ML ~~LOC~~ SOLN
40.0000 mg | SUBCUTANEOUS | Status: DC
  Administered 2015-05-07 – 2015-05-08 (×3): 40 mg via SUBCUTANEOUS
  Filled 2015-05-06 (×3): qty 0.4

## 2015-05-06 MED ORDER — PROPOFOL 1000 MG/100ML IV EMUL
5.0000 ug/kg/min | INTRAVENOUS | Status: DC
  Administered 2015-05-07: 65 ug/kg/min via INTRAVENOUS
  Administered 2015-05-07: 66.219 ug/kg/min via INTRAVENOUS
  Administered 2015-05-07: 60 ug/kg/min via INTRAVENOUS
  Filled 2015-05-06 (×3): qty 100

## 2015-05-06 MED ORDER — SODIUM CHLORIDE 0.9 % IV BOLUS (SEPSIS)
1000.0000 mL | Freq: Once | INTRAVENOUS | Status: AC
  Administered 2015-05-06: 1000 mL via INTRAVENOUS

## 2015-05-06 MED ORDER — LORAZEPAM 2 MG/ML IJ SOLN
2.0000 mg | Freq: Once | INTRAMUSCULAR | Status: AC
  Administered 2015-05-06: 2 mg via INTRAVENOUS

## 2015-05-06 MED ORDER — PROPOFOL 1000 MG/100ML IV EMUL
5.0000 ug/kg/min | Freq: Once | INTRAVENOUS | Status: AC
  Administered 2015-05-06: 10.291 ug/kg/min via INTRAVENOUS

## 2015-05-06 NOTE — ED Notes (Signed)

## 2015-05-06 NOTE — ED Notes (Signed)
Stephanie from [employer] control called. Recommended bicarb bolus if QRS >140, replace electrolytes QTc >500.  Call poison control back if needed otherwise they will follow up tomorrow.

## 2015-05-06 NOTE — ED Notes (Signed)
Pt had second seizure, witnessed by staff, pt became apneic at 84% RA staring blankly, MD at bedside and decision was made to intubate pt at this time.

## 2015-05-06 NOTE — ED Notes (Signed)
BEHAVIORAL HEALTH ROUNDING Patient sleeping: Yes.   Patient alert and oriented: not applicable Behavior appropriate: Yes.  ; If no, describe: intubated Nutrition and fluids offered: Yes  Toileting and hygiene offered: Yes  Sitter present: yes Law enforcement present: Yes

## 2015-05-06 NOTE — ED Notes (Signed)
BEHAVIORAL HEALTH ROUNDING Patient sleeping: Yes.   Patient alert and oriented: not applicable Behavior appropriate: Yes.  ; Pt intubated Nutrition and fluids offered: No; pt intubated Toileting and hygiene offered: No; pt intubated and foley catheter Sitter present: yes Law enforcement present: Yes

## 2015-05-06 NOTE — ED Notes (Signed)
BEHAVIORAL HEALTH ROUNDING Patient sleeping: No. Patient alert and oriented: no Behavior appropriate: No.; If no, describe: tremors, staring off Nutrition and fluids offered: Yes  Toileting and hygiene offered: Yes  Sitter present: yes Law enforcement present: Yes

## 2015-05-06 NOTE — ED Provider Notes (Addendum)
[facility] Emergency Department Provider Note  Time seen: 6:00 PM  I have reviewed the triage vital signs and the nursing notes.   HISTORY  Chief Complaint Suicidal and Drug Overdose    HPI Kathleen Gray is a 39 y.o. female with a past medical history of depression, anxiety, hypothyroidism who presents the emergency department after an overdose. According to EMS the patient admitted to taking 90-100 mg Wellbutrin XL tablets. Patient does have a history of an attempted suicide several months ago with carbon monoxide and narcotic overdose. Per EMS the patient stated she took the tablets at 3:15 PM, because she wanted to kill her self. States she broke up with her boyfriend yesterday. Upon arrival to the emergency department the patient has very slurred speech, will follow very basic commands, unable to answer questions with accuracy. Is awake, alert, with an intoxicated presentation.    Past Medical History  Diagnosis Date  . Hypothyroidism   . Depression   . Anxiety     Patient Active Problem List   Diagnosis Date Noted  . PTSD (post-traumatic stress disorder) 03/14/2015  . Major depressive disorder, recurrent, severe without psychotic features (HCC) 03/14/2015  . Major depressive disorder, recurrent severe without psychotic features (HCC) 03/01/2015  . Anxiety, generalized 01/14/2015  . H/O: hypothyroidism 01/14/2015  . Insomnia, persistent 01/14/2015  . Depression, major, recurrent, moderate (HCC) 01/14/2015    Past Surgical History  Procedure Laterality Date  . Cosmetic surgery      Current Outpatient Rx  Name  Route  Sig  Dispense  Refill  . buPROPion (WELLBUTRIN XL) 150 MG 24 hr tablet   Oral   Take 3 tablets (450 mg total) by mouth daily.   90 tablet   0   . levothyroxine (SYNTHROID) 150 MCG tablet   Oral   Take by mouth.         . traZODone (DESYREL) 50 MG tablet      Take one to two tablets at bedtime as needed for insomnia.   60  tablet   0     Allergies Review of patient's allergies indicates no known allergies.  Family History  Problem Relation Age of Onset  . Depression Mother   . Drug abuse Mother   . Heart disease Father   . Alcohol abuse Father   . Depression Father   . Diabetes Father   . Thyroid disease Father   . Depression Sister   . Polycystic ovary syndrome Sister     Social History Social History  Substance Use Topics  . Smoking status: Never Smoker   . Smokeless tobacco: Never Used  . Alcohol Use: 0.6 oz/week    0 Standard drinks or equivalent, 1 Glasses of wine, 0 Cans of beer, 0 Shots of liquor per week    Review of Systems Unable to obtain an accurate review of systems due to clinical intoxication/overdose, currently with an altered mental status.  ____________________________________________   PHYSICAL EXAM:  VITAL SIGNS: ED Triage Vitals  Enc Vitals Group     BP 05/07/2015 1756 118/82 mmHg     Pulse Rate 05/24/2015 1756 114     Resp 05/08/2015 1756 28     Temp 05/18/2015 1756 97.3 F (36.3 C)     Temp Source 05/16/2015 1756 Axillary     SpO2 05/01/2015 1756 99 %     Weight 05/28/2015 1756 164 lb 4.8 oz (74.526 kg)     Height 05/02/2015 1756  (1.651 m)  Head Cir --      Peak Flow --      Pain Score --      Pain Loc --      Pain Edu? --      Excl. in GC? --     Constitutional: Alert, awake, will make eye contact, slurred speech, unable to answer simple questions. Eyes  rotary nystagmus on exam. ENT   Head: Normocephalic and atraumatic.   Mouth/Throat: Dry mucous membranes. Cardiovascular: Regular rhythm and rate around 120 bpm. No murmurs auscultated. Respiratory: Mild tachypnea. Breath sounds are clear and equal bilaterally. No wheezes/rales/rhonchi. Gastrointestinal: Soft, no reaction to abdominal palpation. Musculoskeletal: Able to move all extremities. Neurologic:  Slurred speech, appears intoxicated, will occasionally follow very simple commands, cannot  answer questions appropriately. Skin:  Skin is warm, dry and intact.   ____________________________________________    EKG  EKG reviewed and interpreted by myself shows sinus tachycardia 115 bpm, slightly widened QRS of 122 ms, slightly prolonged QTC of 520 ms. Nonspecific ST changes present. No ST elevations noted.  ____________________________________________    INITIAL IMPRESSION / ASSESSMENT AND PLAN / ED COURSE  Pertinent labs & imaging results that were available during my care of the patient were reviewed by me and considered in my medical decision making (see chart for details).  Patient presents with a likely Wellbutrin XL Overdose. Patient unable to give a history in the emergency department. Awake and alert, but very impaired on examination with nystagmus, very slurred speech. Does not make sense. Will occasionally follow commands. Shortly after examination the patient had a generalized tonic-clonic seizure lasting approximately 20-30 seconds. Patient is now postictal, but is awake and making eye contact, gag reflex intact, corneal reflexes intact. I discussed the patient with poison control who states seizures are very common as well as tachycardia as well as QTC and QRS prolongation. They recommend supportive care and admission to the hospital for 24-hour monitoring. Poison control states the seizures typically respond well to benzodiazepine's, the patient will require telemetry as Wellbutrin can cause cardiac arrhythmias. We will continue IV hydration, and monitor the patient very closely in the emergency department while awaiting other laboratory results to rule out coingestions.  ----------------------------------------- 7:02 PM on 05/18/2015 -----------------------------------------  Patient has again suffered a generalized tonic-clonic seizure, this time with periods of apnea with desaturations. Patient given 2 mg of Ativan. Seizure has stopped the patient remains very  somnolent, minimal response to sternal rub. At this time given the likelihood of recurrent seizures, along with her periods of apnea and decreased responsiveness I made the decision to intubate the patient for airway control. No complications with intubation, we will obtain a chest x-ray to confirm placement. Labs are largely within normal limits. The patient will be admitted to the medical service for medical stabilization/treatment, ultimately will require psychiatric consultation. Patient is under an involuntary commitment currently.   CRITICAL CARE Performed by: Minna Antis   Total critical care time: 60 minutes  Critical care time was exclusive of separately billable procedures and treating other patients.  Critical care was necessary to treat or prevent imminent or life-threatening deterioration.  Critical care was time spent personally by me on the following activities: development of treatment plan with patient and/or surrogate as well as nursing, discussions with consultants, evaluation of patient's response to treatment, examination of patient, obtaining history from patient or surrogate, ordering and performing treatments and interventions, ordering and review of laboratory studies, ordering and review of radiographic studies, pulse oximetry and  re-evaluation of patient's condition.   INTUBATION Performed by: Minna Antis  Required items: required blood products, implants, devices, and special equipment available Patient identity confirmed: provided demographic data and hospital-assigned identification number Time out: Immediately prior to procedure a "time out" was called to verify the correct patient, procedure, equipment, support staff and site/side marked as required.  Indications: Airway control   Intubation method: 4.0 Glidescope Laryngoscopy   Preoxygenation: 100% BVM  Sedatives: 20 mg Etomidate Paralytic: 70 mg rocuronium  Tube Size: 7.5  cuffed  Post-procedure assessment: chest rise and ETCO2 monitor Breath sounds: equal and absent over the epigastrium Tube secured with: ETT holder Chest x-ray interpreted by radiologist and me.  Chest x-ray findings: endotracheal tube in appropriate position  Patient tolerated the procedure well with no immediate complications.      ____________________________________________   FINAL CLINICAL IMPRESSION(S) / ED DIAGNOSES  Suicide attempt Intentional overdose   Minna Antis, MD 05/02/2015 1903  Minna Antis, MD 05/05/2015 [phone]

## 2015-05-06 NOTE — ED Notes (Signed)
Pt started having seizure at this time, arms and legs tensing, seizure lasted about 30 seconds, pt returned to baseline, MD called to bedside.  No further orders at this time.

## 2015-05-06 NOTE — ED Notes (Signed)
Pt to ED via EMS from home c/o drug overdose and SI.  Per EMS pt overdosed on 92 wellbutrin at home.  EMS states pt broke up with boyfriend yesterday.  Stated in EMS ride "I wish I had just hung myself".  Pt has hx of suicide several months ago by taking a hose to the exhaust of car, and a previous attempt in January.  Pt stated upon arrival to ED "the rooms spinning".  Pt has bruised knuckles, staring off, pupils equal and brisk, mumbled speech.  Per EMS pt son called EMS tonight.

## 2015-05-06 NOTE — H&P (Signed)
[facility] - [place] [facility]   PATIENT NAME: Kathleen Gray    MR#:  [id number]  DATE OF BIRTH:  04/30/1976  DATE OF ADMISSION:  05/23/2015  PRIMARY CARE PHYSICIAN: No PCP Per Patient   REQUESTING/REFERRING PHYSICIAN: [email or web address]  CHIEF COMPLAINT:   Chief Complaint  Patient presents with  . Suicidal  . Drug Overdose    HISTORY OF PRESENT ILLNESS: Kathleen Gray  is a 39 y.o. female with a known history of hypothyroidism, depression, anxiety called EMS today by herself and when EMS arrived she was completely alert and oriented she told them she took her medication for suicidal ideation. She told she took 92 tablets of Wellbutrin 100 mg each. She was brought to emergency room by them and gradually she became drowsy and confused and she also had 2 seizures episodes in the emergency room so she was given injection Ativan and intubated for airway protection and given to hospitalist team for further management. Patient is currently on ventilator and unable to give me any history. The above-mentioned history is obtained from ER physician.  PAST MEDICAL HISTORY:   Past Medical History  Diagnosis Date  . Hypothyroidism   . Depression   . Anxiety     PAST SURGICAL HISTORY:  Past Surgical History  Procedure Laterality Date  . Cosmetic surgery      SOCIAL HISTORY:  Social History  Substance Use Topics  . Smoking status: Never Smoker   . Smokeless tobacco: Never Used  . Alcohol Use: 0.6 oz/week    0 Standard drinks or equivalent, 1 Glasses of wine, 0 Cans of beer, 0 Shots of liquor per week    FAMILY HISTORY:  Family History  Problem Relation Age of Onset  . Depression Mother   . Drug abuse Mother   . Heart disease Father   . Alcohol abuse Father   . Depression Father   . Diabetes Father   . Thyroid disease Father   . Depression Sister   . Polycystic ovary syndrome Sister     DRUG ALLERGIES: No Known Allergies  REVIEW OF SYSTEMS:   Patient is  intubated and not able to give me any further details.  MEDICATIONS AT HOME:  Prior to Admission medications   Medication Sig Start Date End Date Taking? Authorizing Provider  buPROPion (WELLBUTRIN XL) 150 MG 24 hr tablet Take 3 tablets (450 mg total) by mouth daily. 03/14/15  Yes Kerin Salen, MD  levothyroxine (SYNTHROID, LEVOTHROID) 150 MCG tablet Take 150 mcg by mouth daily before breakfast.   Yes Historical Provider, MD  Norgestimate-Ethinyl Estradiol Triphasic (ORTHO TRI-CYCLEN LO) 0.18/0.215/0.25 MG-25 MCG tab Take 1 tablet by mouth daily.   Yes Historical Provider, MD  traZODone (DESYREL) 50 MG tablet Take 50-100 mg by mouth at bedtime as needed for sleep.   Yes Historical Provider, MD      PHYSICAL EXAMINATION:   VITAL SIGNS: Blood pressure 101/66, pulse 114, temperature 97.3 F (36.3 C), temperature source Axillary, resp. rate 23, height  (1.651 m), weight 74.526 kg (164 lb 4.8 oz), SpO2 100 %.  GENERAL:  39 y.o.-year-old patient lying in the bed, sedated on ventilator support.  EYES: Pupils equal, round, dilated up to 5-6 mm, reactive to light. No scleral icterus.   HEENT: Head atraumatic, normocephalic. Oropharynx and nasopharynx clear. ETT in place. NECK:  Supple, no jugular venous distention. No thyroid enlargement.  LUNGS: Normal breath sounds bilaterally, no wheezing, rales,rhonchi or crepitation. No use of accessory muscles  of respiration. On ventilator support. CARDIOVASCULAR: S1, S2 normal. No murmurs, rubs, or gallops.  ABDOMEN: Soft, nontender, nondistended. Bowel sounds present. No organomegaly or mass.  EXTREMITIES: No pedal edema, cyanosis, or clubbing.  NEUROLOGIC: Patient is sedated and on ventilator support currently.  PSYCHIATRIC: Sedated on ventilator, not able to check.  SKIN: No obvious rash, lesion, or ulcer.   LABORATORY PANEL:   CBC  Recent Labs Lab 05-22-2015 1744  WBC 10.4  HGB 14.2  HCT 43.3  PLT 298  MCV 87.1  MCH 28.5  MCHC 32.8   RDW 13.1  LYMPHSABS 2.6  MONOABS 0.7  EOSABS 0.3  BASOSABS 0.1   ------------------------------------------------------------------------------------------------------------------  Chemistries   Recent Labs Lab 05-22-15 1744  NA 143  K 2.9*  CL 111  CO2 19*  GLUCOSE 114*  BUN 12  CREATININE 0.86  CALCIUM 8.6*  AST 30  ALT 30  ALKPHOS 51  BILITOT 1.0   ------------------------------------------------------------------------------------------------------------------ estimated creatinine clearance is 90.5 mL/min (by C-G formula based on Cr of 0.86). ------------------------------------------------------------------------------------------------------------------ No results for input(s): TSH, T4TOTAL, T3FREE, THYROIDAB in the last 72 hours.  Invalid input(s): FREET3   Coagulation profile No results for input(s): INR, PROTIME in the last 168 hours. ------------------------------------------------------------------------------------------------------------------- No results for input(s): DDIMER in the last 72 hours. -------------------------------------------------------------------------------------------------------------------  Cardiac Enzymes No results for input(s): CKMB, TROPONINI, MYOGLOBIN in the last 168 hours.  Invalid input(s): CK ------------------------------------------------------------------------------------------------------------------ Invalid input(s): POCBNP  ---------------------------------------------------------------------------------------------------------------  Urinalysis    Component Value Date/Time   COLORURINE YELLOW* 2015/05/22 1744   COLORURINE Yellow 08/23/2014 0440   APPEARANCEUR CLEAR* 22-May-2015 1744   APPEARANCEUR Hazy 08/23/2014 0440   LABSPEC 1.017 2015/05/22 1744   LABSPEC 1.020 08/23/2014 0440   PHURINE 5.0 05-22-15 1744   PHURINE 6.0 08/23/2014 0440   GLUCOSEU NEGATIVE 05/22/15 1744   GLUCOSEU Negative  08/23/2014 0440   HGBUR NEGATIVE 05/22/15 1744   HGBUR 3+ 08/23/2014 0440   BILIRUBINUR NEGATIVE 05-22-15 1744   BILIRUBINUR Negative 08/23/2014 0440   KETONESUR TRACE* 05-22-2015 1744   KETONESUR Negative 08/23/2014 0440   PROTEINUR NEGATIVE 22-May-2015 1744   PROTEINUR 30 mg/dL 40/98/1191 4782   NITRITE NEGATIVE May 22, 2015 1744   NITRITE Negative 08/23/2014 0440   LEUKOCYTESUR NEGATIVE 05-22-2015 1744   LEUKOCYTESUR Negative 08/23/2014 0440     RADIOLOGY: Dg Chest Portable 1 View  2015-05-22   CLINICAL DATA:  Seizure, duration 30 seconds.  Post intubation.  EXAM: PORTABLE CHEST 1 VIEW  COMPARISON:  None  FINDINGS: The endotracheal tube is 2.1 cm above the carina. The lungs are grossly clear. No pneumothorax or pneumomediastinum is evident. No large effusion is evident.  IMPRESSION: Satisfactory ET tube position.  The lungs are grossly clear.   Electronically Signed   By: Ellery Plunk M.D.   On: 05/22/2015 19:33    EKG: Sinus tachycardia.   IMPRESSION AND PLAN:  * Intentional drug overdose causing altered mental status  Patient is kept on involuntary commitment, when she is extubated we need to get psychiatric consult for further management.  She took Wellbutrin tablets- 92 tablets of 100 mg each.  As per poison control center there would be high risk for arrhythmia and QRS prolongation, and she can also get seizures which she already had.  We will monitor in ICU.  I tried to get further information about her family numbers so I can inform them but in her emergency contact list there is only a friend's number- and at this time I do not think it is appropriate to  reveal her information to her on phone so we will continue monitoring in ICU and wait until she is able to be extubated.  * Seizures  This is a effect of drug overdose, currently seizures stopped.  Patient is on ventilator and sedated continue monitoring.  * Hypokalemia  I will give IV fluid with potassium,  continue monitoring, check magnesium.   All the records are reviewed and case discussed with ED provider. Management plans discussed with the patient, family and they are in agreement.  CODE STATUS: full  TOTAL TIME TAKING CARE OF THIS PATIENT:50 critical care minutes.  As there is no direct family contact number available in her chart, no family is contacted at this time once we have more information Gerlene Burdock inform the family about her being in the ICU.  Altamese Dilling M.D on 05/23/2015   Between 7am to 6pm - Pager - [phone]  After 6pm go to [email or web address] - password [email or web address]  [place] [facility]  Office  [phone]  CC: Primary care physician; No PCP Per Patient   Note: This dictation was prepared with Dragon dictation along with smaller phrase technology. Any transcriptional errors that result from this process are unintentional.

## 2015-05-06 NOTE — Progress Notes (Signed)
eLink Physician-Brief Progress Note Patient Name: Kathleen Gray DOB: [birth year] MRN: [id number]   Date of Service  May 07, 2015  HPI/Events of Note  73 F with h/o of hypothyroidism, depression and anxiety disorder now intubated in ICU following intentional overdose with Wellbutrin.  In ED patient became progressively unresponsive with 2 seizure episodes.  Currently patient is intubated HD stable with sats of 97%  eICU Interventions  Plan of care per primary team. There is a request on chart for vent management per PCCM - have added to list to be seen in AM F/U PCXR in AM for ETT position and lung field evaluation along with ABG PPI added for stress ulcer propy while intubated     Intervention Category Evaluation Type: New Patient Evaluation  DETERDING,ELIZABETH 07-May-2015, 11:58 PM

## 2015-05-07 ENCOUNTER — Inpatient Hospital Stay: Payer: Medicaid Other

## 2015-05-07 DIAGNOSIS — T50904S Poisoning by unspecified drugs, medicaments and biological substances, undetermined, sequela: Secondary | ICD-10-CM

## 2015-05-07 DIAGNOSIS — J9690 Respiratory failure, unspecified, unspecified whether with hypoxia or hypercapnia: Secondary | ICD-10-CM

## 2015-05-07 LAB — BLOOD GAS, ARTERIAL
ACID-BASE DEFICIT: 2.5 mmol/L — AB (ref 0.0–2.0)
Bicarbonate: 21.7 mEq/L (ref 21.0–28.0)
FIO2: 0.3
MECHVT: 450 mL
Mechanical Rate: 20
O2 SAT: 98.5 %
PCO2 ART: 35 mmHg (ref 32.0–48.0)
PEEP/CPAP: 5 cmH2O
PH ART: 7.4 (ref 7.350–7.450)
Patient temperature: 37
pO2, Arterial: 114 mmHg — ABNORMAL HIGH (ref 83.0–108.0)

## 2015-05-07 LAB — MRSA PCR SCREENING: MRSA by PCR: NEGATIVE

## 2015-05-07 LAB — BASIC METABOLIC PANEL
Anion gap: 9 (ref 5–15)
BUN: 13 mg/dL (ref 6–20)
CHLORIDE: 112 mmol/L — AB (ref 101–111)
CO2: 22 mmol/L (ref 22–32)
Calcium: 8.5 mg/dL — ABNORMAL LOW (ref 8.9–10.3)
Creatinine, Ser: 0.77 mg/dL (ref 0.44–1.00)
Glucose, Bld: 98 mg/dL (ref 65–99)
POTASSIUM: 3.7 mmol/L (ref 3.5–5.1)
SODIUM: 143 mmol/L (ref 135–145)

## 2015-05-07 LAB — CBC
HEMATOCRIT: 40.7 % (ref 35.0–47.0)
Hemoglobin: 13.3 g/dL (ref 12.0–16.0)
MCH: 28.5 pg (ref 26.0–34.0)
MCHC: 32.8 g/dL (ref 32.0–36.0)
MCV: 86.9 fL (ref 80.0–100.0)
PLATELETS: 271 10*3/uL (ref 150–440)
RBC: 4.69 MIL/uL (ref 3.80–5.20)
RDW: 13.2 % (ref 11.5–14.5)
WBC: 19.2 10*3/uL — AB (ref 3.6–11.0)

## 2015-05-07 MED ORDER — AMANTADINE HCL 50 MG/5ML PO SYRP
100.0000 mg | ORAL_SOLUTION | Freq: Two times a day (BID) | ORAL | Status: DC
  Administered 2015-05-07 – 2015-05-08 (×2): 100 mg via ORAL
  Filled 2015-05-07 (×3): qty 10

## 2015-05-07 MED ORDER — ANTISEPTIC ORAL RINSE SOLUTION (CORINZ)
7.0000 mL | Freq: Four times a day (QID) | OROMUCOSAL | Status: DC
  Administered 2015-05-07 – 2015-05-15 (×36): 7 mL via OROMUCOSAL
  Administered 2015-05-15: 14 mL via OROMUCOSAL
  Administered 2015-05-16 – 2015-05-19 (×15): 7 mL via OROMUCOSAL
  Filled 2015-05-07 (×53): qty 7

## 2015-05-07 MED ORDER — FENTANYL 2500MCG IN NS 250ML (10MCG/ML) PREMIX INFUSION
10.0000 ug/h | INTRAVENOUS | Status: DC
  Administered 2015-05-07: 400 ug/h via INTRAVENOUS
  Administered 2015-05-07: 200 ug/h via INTRAVENOUS
  Filled 2015-05-07: qty 250

## 2015-05-07 MED ORDER — CHLORHEXIDINE GLUCONATE 0.12% ORAL RINSE (MEDLINE KIT)
15.0000 mL | Freq: Two times a day (BID) | OROMUCOSAL | Status: DC
  Administered 2015-05-07 – 2015-05-17 (×20): 15 mL via OROMUCOSAL
  Filled 2015-05-07 (×23): qty 15

## 2015-05-07 MED ORDER — INFLUENZA VAC SPLIT QUAD 0.5 ML IM SUSY: 0.5000 mL | PREFILLED_SYRINGE | INTRAMUSCULAR | Status: DC

## 2015-05-07 MED ORDER — LORAZEPAM 2 MG/ML IJ SOLN
2.0000 mg | INTRAMUSCULAR | Status: DC | PRN
  Administered 2015-05-07 – 2015-05-10 (×14): 2 mg via INTRAVENOUS
  Filled 2015-05-07 (×8): qty 1
  Filled 2015-05-07: qty 2
  Filled 2015-05-07 (×6): qty 1

## 2015-05-07 MED ORDER — MIDAZOLAM HCL 5 MG/ML IJ SOLN
3.0000 mg/h | INTRAMUSCULAR | Status: DC
  Administered 2015-05-07: 1 mg/h via INTRAVENOUS
  Administered 2015-05-08: 2 mg/h via INTRAVENOUS
  Administered 2015-05-08 – 2015-05-09 (×2): 3 mg/h via INTRAVENOUS
  Filled 2015-05-07 (×4): qty 10

## 2015-05-07 MED ORDER — PANTOPRAZOLE SODIUM 40 MG IV SOLR
40.0000 mg | INTRAVENOUS | Status: DC
  Administered 2015-05-07 – 2015-05-08 (×3): 40 mg via INTRAVENOUS
  Filled 2015-05-07 (×3): qty 40

## 2015-05-07 MED ORDER — FENTANYL 2500MCG IN NS 250ML (10MCG/ML) PREMIX INFUSION
INTRAVENOUS | Status: AC
  Administered 2015-05-07: 200 ug/h via INTRAVENOUS
  Filled 2015-05-07: qty 250

## 2015-05-07 NOTE — Consult Note (Signed)
Reason for Consult: seizure Referring Physician: Dr. Kathrene Alu is an 39 y.o. female.  HPI: seen at request of Dr. Mortimer Fries for seizure;  39 yo RHD F presents to [facility] after taking like 90 pills of 184m Wellbutrin;  Pt had an intentional overdose as suicide attempt.  Pt was initially alert after arriving to ER but suddenly declined to the point that she needed to be intubated.  In ICU, she was witnessed to have continued unusual movements and two tonic seizures.    Past Medical History  Diagnosis Date  . Hypothyroidism   . Depression   . Anxiety     Past Surgical History  Procedure Laterality Date  . Cosmetic surgery      Family History  Problem Relation Age of Onset  . Depression Mother   . Drug abuse Mother   . Heart disease Father   . Alcohol abuse Father   . Depression Father   . Diabetes Father   . Thyroid disease Father   . Depression Sister   . Polycystic ovary syndrome Sister     Social History:  reports that she has never smoked. She has never used smokeless tobacco. She reports that she drinks about 0.6 oz of alcohol per week. She reports that she does not use illicit drugs.  Allergies: No Known Allergies  Medications: personally reviewed by me as per chart  Results for orders placed or performed during the hospital encounter of 05/29/2015 (from the past 48 hour(s))  Magnesium     Status: None   Collection Time: 05/05/15  9:00 PM  Result Value Ref Range   Magnesium 2.0 1.7 - 2.4 mg/dL  Comprehensive metabolic panel     Status: Abnormal   Collection Time: 05/11/2015  5:44 PM  Result Value Ref Range   Sodium 143 135 - 145 mmol/L   Potassium 2.9 (LL) 3.5 - 5.1 mmol/L    Comment: CRITICAL RESULT CALLED TO, READ BACK BY AND VERIFIED WITH  STEPHEN JONES AT 1855 05/14/2015 SDR    Chloride 111 101 - 111 mmol/L   CO2 19 (L) 22 - 32 mmol/L   Glucose, Bld 114 (H) 65 - 99 mg/dL   BUN 12 6 - 20 mg/dL   Creatinine, Ser 0.86 0.44 - 1.00 mg/dL   Calcium 8.6 (L) 8.9 - 10.3  mg/dL   Total Protein 7.0 6.5 - 8.1 g/dL   Albumin 4.3 3.5 - 5.0 g/dL   AST 30 15 - 41 U/L   ALT 30 14 - 54 U/L   Alkaline Phosphatase 51 38 - 126 U/L   Total Bilirubin 1.0 0.3 - 1.2 mg/dL   GFR calc non Af Amer >60 >60 mL/min   GFR calc Af Amer >60 >60 mL/min    Comment: (NOTE) The eGFR has been calculated using the CKD EPI equation. This calculation has not been validated in all clinical situations. eGFR's persistently <60 mL/min signify possible Chronic Kidney Disease.    Anion gap 13 5 - 15  Ethanol     Status: Abnormal   Collection Time: 05/05/2015  5:44 PM  Result Value Ref Range   Alcohol, Ethyl (B) 6 (H) <5 mg/dL    Comment:        LOWEST DETECTABLE LIMIT FOR SERUM ALCOHOL IS 5 mg/dL FOR MEDICAL PURPOSES ONLY   Lipase, blood     Status: None   Collection Time: 05/04/2015  5:44 PM  Result Value Ref Range   Lipase 25 22 - 51 U/L  Salicylate level     Status: None   Collection Time: 05/20/2015  5:44 PM  Result Value Ref Range   Salicylate Lvl <9.3 2.8 - 30.0 mg/dL  Acetaminophen level     Status: Abnormal   Collection Time: 05/23/2015  5:44 PM  Result Value Ref Range   Acetaminophen (Tylenol), Serum <10 (L) 10 - 30 ug/mL    Comment:        THERAPEUTIC CONCENTRATIONS VARY SIGNIFICANTLY. A RANGE OF 10-30 ug/mL MAY BE AN EFFECTIVE CONCENTRATION FOR MANY PATIENTS. HOWEVER, SOME ARE BEST TREATED AT CONCENTRATIONS OUTSIDE THIS RANGE. ACETAMINOPHEN CONCENTRATIONS >150 ug/mL AT 4 HOURS AFTER INGESTION AND >50 ug/mL AT 12 HOURS AFTER INGESTION ARE OFTEN ASSOCIATED WITH TOXIC REACTIONS.   CBC with Differential     Status: Abnormal   Collection Time: 05/14/2015  5:44 PM  Result Value Ref Range   WBC 10.4 3.6 - 11.0 K/uL   RBC 4.97 3.80 - 5.20 MIL/uL   Hemoglobin 14.2 12.0 - 16.0 g/dL   HCT 43.3 35.0 - 47.0 %   MCV 87.1 80.0 - 100.0 fL   MCH 28.5 26.0 - 34.0 pg   MCHC 32.8 32.0 - 36.0 g/dL   RDW 13.1 11.5 - 14.5 %   Platelets 298 150 - 440 K/uL   Neutrophils Relative %  65 %   Neutro Abs 6.7 (H) 1.4 - 6.5 K/uL   Lymphocytes Relative 25 %   Lymphs Abs 2.6 1.0 - 3.6 K/uL   Monocytes Relative 6 %   Monocytes Absolute 0.7 0.2 - 0.9 K/uL   Eosinophils Relative 3 %   Eosinophils Absolute 0.3 0 - 0.7 K/uL   Basophils Relative 1 %   Basophils Absolute 0.1 0 - 0.1 K/uL  Pregnancy, urine     Status: None   Collection Time: 05/10/2015  5:44 PM  Result Value Ref Range   Preg Test, Ur NEGATIVE NEGATIVE  Urinalysis complete, with microscopic     Status: Abnormal   Collection Time: 05/15/2015  5:44 PM  Result Value Ref Range   Color, Urine YELLOW (A) YELLOW   APPearance CLEAR (A) CLEAR   Glucose, UA NEGATIVE NEGATIVE mg/dL   Bilirubin Urine NEGATIVE NEGATIVE   Ketones, ur TRACE (A) NEGATIVE mg/dL   Specific Gravity, Urine 1.017 1.005 - 1.030   Hgb urine dipstick NEGATIVE NEGATIVE   pH 5.0 5.0 - 8.0   Protein, ur NEGATIVE NEGATIVE mg/dL   Nitrite NEGATIVE NEGATIVE   Leukocytes, UA NEGATIVE NEGATIVE   RBC / HPF 0-5 0 - 5 RBC/hpf   WBC, UA 6-30 0 - 5 WBC/hpf   Bacteria, UA RARE (A) NONE SEEN   Squamous Epithelial / LPF NONE SEEN NONE SEEN   Mucous PRESENT    Hyaline Casts, UA PRESENT   Urine Drug Screen, Qualitative     Status: Abnormal   Collection Time: 05/01/2015  5:44 PM  Result Value Ref Range   Tricyclic, Ur Screen NONE DETECTED NONE DETECTED   Amphetamines, Ur Screen NONE DETECTED NONE DETECTED   MDMA (Ecstasy)Ur Screen NONE DETECTED NONE DETECTED   Cocaine Metabolite,Ur [place] NONE DETECTED NONE DETECTED   Opiate, Ur Screen NONE DETECTED NONE DETECTED   Phencyclidine (PCP) Ur S POSITIVE (A) NONE DETECTED   Cannabinoid 50 Ng, Ur [place] NONE DETECTED NONE DETECTED   Barbiturates, Ur Screen NONE DETECTED NONE DETECTED   Benzodiazepine, Ur Scrn POSITIVE (A) NONE DETECTED   Methadone Scn, Ur NONE DETECTED NONE DETECTED    Comment: (NOTE) 818  Tricyclics,  urine               Cutoff 1000 ng/mL 200  Amphetamines, urine             Cutoff 1000 ng/mL 300  MDMA  (Ecstasy), urine           Cutoff 500 ng/mL 400  Cocaine Metabolite, urine       Cutoff 300 ng/mL 500  Opiate, urine                   Cutoff 300 ng/mL 600  Phencyclidine (PCP), urine      Cutoff 25 ng/mL 700  Cannabinoid, urine              Cutoff 50 ng/mL 800  Barbiturates, urine             Cutoff 200 ng/mL 900  Benzodiazepine, urine           Cutoff 200 ng/mL 1000 Methadone, urine                Cutoff 300 ng/mL 1100 1200 The urine drug screen provides only a preliminary, unconfirmed 1300 analytical test result and should not be used for non-medical 1400 purposes. Clinical consideration and professional judgment should 1500 be applied to any positive drug screen result due to possible 1600 interfering substances. A more specific alternate chemical method 1700 must be used in order to obtain a confirmed analytical result.  1800 Gas chromato graphy / mass spectrometry (GC/MS) is the preferred 1900 confirmatory method.   Blood gas, arterial     Status: Abnormal   Collection Time: 05/17/2015  8:05 PM  Result Value Ref Range   FIO2 0.40    Delivery systems VENTILATOR    Mode ASSIST CONTROL    VT 450 mL   Peep/cpap 5.0 cm H20   pH, Arterial 7.39 7.350 - 7.450   pCO2 arterial 34 32.0 - 48.0 mmHg   pO2, Arterial 153 (H) 83.0 - 108.0 mmHg   Bicarbonate 20.6 (L) 21.0 - 28.0 mEq/L   Acid-base deficit 3.6 (H) 0.0 - 2.0 mmol/L   O2 Saturation 99.3 %   Patient temperature 37.0    Collection site LEFT RADIAL    Sample type ARTERIAL DRAW    Allens test (pass/fail) PASS PASS   Mechanical Rate 20   MRSA PCR Screening     Status: None   Collection Time: 05/28/2015 11:05 PM  Result Value Ref Range   MRSA by PCR NEGATIVE NEGATIVE    Comment:        The GeneXpert MRSA Assay (FDA approved for NASAL specimens only), is one component of a comprehensive MRSA colonization surveillance program. It is not intended to diagnose MRSA infection nor to guide or monitor treatment for MRSA  infections.   Blood gas, arterial     Status: Abnormal   Collection Time: 05/07/15  4:40 AM  Result Value Ref Range   FIO2 0.30    Delivery systems VENTILATOR    Mode PRESSURE REGULATED VOLUME CONTROL    VT 450 mL   Peep/cpap 5.0 cm H20   pH, Arterial 7.40 7.350 - 7.450   pCO2 arterial 35 32.0 - 48.0 mmHg   pO2, Arterial 114 (H) 83.0 - 108.0 mmHg   Bicarbonate 21.7 21.0 - 28.0 mEq/L   Acid-base deficit 2.5 (H) 0.0 - 2.0 mmol/L   O2 Saturation 98.5 %   Patient temperature 37.0    Collection site LEFT RADIAL    Sample type  ARTERIAL DRAW    Allens test (pass/fail) PASS PASS   Mechanical Rate 20   Basic metabolic panel     Status: Abnormal   Collection Time: 05/07/15  6:19 AM  Result Value Ref Range   Sodium 143 135 - 145 mmol/L   Potassium 3.7 3.5 - 5.1 mmol/L   Chloride 112 (H) 101 - 111 mmol/L   CO2 22 22 - 32 mmol/L   Glucose, Bld 98 65 - 99 mg/dL   BUN 13 6 - 20 mg/dL   Creatinine, Ser 0.77 0.44 - 1.00 mg/dL   Calcium 8.5 (L) 8.9 - 10.3 mg/dL   GFR calc non Af Amer >60 >60 mL/min   GFR calc Af Amer >60 >60 mL/min    Comment: (NOTE) The eGFR has been calculated using the CKD EPI equation. This calculation has not been validated in all clinical situations. eGFR's persistently <60 mL/min signify possible Chronic Kidney Disease.    Anion gap 9 5 - 15  CBC     Status: Abnormal   Collection Time: 05/07/15  6:19 AM  Result Value Ref Range   WBC 19.2 (H) 3.6 - 11.0 K/uL   RBC 4.69 3.80 - 5.20 MIL/uL   Hemoglobin 13.3 12.0 - 16.0 g/dL   HCT 40.7 35.0 - 47.0 %   MCV 86.9 80.0 - 100.0 fL   MCH 28.5 26.0 - 34.0 pg   MCHC 32.8 32.0 - 36.0 g/dL   RDW 13.2 11.5 - 14.5 %   Platelets 271 150 - 440 K/uL    Ct Head Wo Contrast  05/07/2015   CLINICAL DATA:  Delayed recovery after sedation.  Seizure activity.  EXAM: CT HEAD WITHOUT CONTRAST  TECHNIQUE: Contiguous axial images were obtained from the base of the skull through the vertex without intravenous contrast.  COMPARISON:   None.  FINDINGS: The brain has a normal appearance without evidence of malformation, atrophy, old or acute infarction, mass lesion, hemorrhage, hydrocephalus or extra-axial collection. The calvarium is unremarkable. The paranasal sinuses, middle ears and mastoids are clear.  IMPRESSION: Normal head CT   Electronically Signed   By: Nelson Chimes M.D.   On: 05/07/2015 11:40   Dg Chest Port 1 View  05/07/2015   CLINICAL DATA:  Respiratory failure requiring intubation  EXAM: PORTABLE CHEST - 1 VIEW  COMPARISON:  the previous day's study  FINDINGS: Endotracheal tube is been partially retracted, tip proximally 3.7 cm above carina. Slightly improved aeration. Minimal residual left infrahilar atelectasis or infiltrate. Lungs otherwise clear. No pneumothorax. Heart size normal. No effusion. Visualized skeletal structures are unremarkable.  IMPRESSION: Endotracheal tube positioning as above with improved aeration.   Electronically Signed   By: Lucrezia Europe M.D.   On: 05/07/2015 09:03   Dg Chest Portable 1 View  05/27/2015   CLINICAL DATA:  Seizure, duration 30 seconds.  Post intubation.  EXAM: PORTABLE CHEST 1 VIEW  COMPARISON:  None  FINDINGS: The endotracheal tube is 2.1 cm above the carina. The lungs are grossly clear. No pneumothorax or pneumomediastinum is evident. No large effusion is evident.  IMPRESSION: Satisfactory ET tube position.  The lungs are grossly clear.   Electronically Signed   By: Andreas Newport M.D.   On: 05/11/2015 19:33   Dg Abd Portable 1v  05/07/2015   CLINICAL DATA:  Bedside orogastric tube placement.  EXAM: PORTABLE ABDOMEN - 1 VIEW  COMPARISON:  None.  FINDINGS: OG tube tip projects at the expected location of the gastric antrum. Bowel gas pattern  unremarkable.  IMPRESSION: 1. OG tube tip projects at the expected location of the gastric antrum. 2. No acute abdominal abnormality.   Electronically Signed   By: Evangeline Dakin M.D.   On: 05/07/2015 16:26    Review of Systems  Unable to  perform ROS: medical condition   Blood pressure 121/68, pulse 116, temperature 100.9 F (38.3 C), temperature source Rectal, resp. rate 20, height 5' 5"  (1.651 m), weight 74.526 kg (164 lb 4.8 oz), SpO2 98 %. Physical Exam  Constitutional: She appears well-developed and well-nourished. She appears distressed.  HENT:  Head: Normocephalic and atraumatic.  Right Ear: External ear normal.  Left Ear: External ear normal.  Nose: Nose normal.  Mouth/Throat: Oropharynx is clear and moist.  Eyes: Conjunctivae and EOM are normal. Pupils are equal, round, and reactive to light.  Neck: Normal range of motion. Neck supple.  Cardiovascular: Regular rhythm, normal heart sounds and intact distal pulses.   Respiratory: Effort normal and breath sounds normal.  GI: Soft. Bowel sounds are normal.  Neurological:  Intubated, sedated, eyes open but does not track or follow Pupils 74m B and minimally reactive, corneal B, decent cough Does not withdrawal but unusual movements throughout the exam, nl tone 1+/4 B, mute plantars No movement to pain  Skin: Skin is warm and dry. She is not diaphoretic. There is erythema.   CT of head personally reviewed by me and is normal  Assessment/Plan: 1.  Possible status epilepticus-  No obvious seizures by me but this does not mean that pt is not having nonconvulsive status.  This is a worry that with a Bupropion overdose this is highly likely too as this medication lowers seizure threshold.  There is no sign of serotonin syndrome which should not be expected by Bupropion but this medication does have an effect on norepinephrine and dopamine.  Currently she looks like a possible movement disorder from the medication.  If she is having seizures, she is at risk for brain damage and death. -  D/c propofol -  Start Versed 152mhr and fentanyl 2531mhr -  No role for other AEDs now -  Start amantadine 100m32mD -  Continue supportive care -  EEG unavailable at this hospital  over weekend plus recommend transfer for continuous EEG monitoring -  Keep Mg > 2, Ca > 8 and Na > 130 -  Will follow  44 minutes of critical care time spent, examining pt, reviewing chart and coordinating care  Tyniesha Howald 05/07/2015, 4:51 PM

## 2015-05-07 NOTE — Progress Notes (Signed)
Spoke w/Neurologist MD Katrinka Blazing by phone, ordered to start a versed drip and d/c the propofol drip.

## 2015-05-07 NOTE — Consult Note (Signed)
[facility] Consultation      Name: Kathleen Gray MRN: [id number] DOB: 1975/11/10    ADMISSION DATE:  05/23/2015    CHIEF COMPLAINT:   Acute resp failure from suicide attempt   HISTORY OF PRESENT ILLNESS   39 yo white female seen today for acute resp failure from acute encephalopathy, s/p suicide attempt from welbutrin Patient noted to have seizure like activity  Patient taken off sedation this AM, patient with unresponsive ness, unresponsive to painful stimuli Pupils intact, opens mouth intermittently, weak gag reflex, no ext rigidity Reflexes seem to be WNL  Will obtain Ct head EEG and Neuro consult    SIGNIFICANT EVENTS  10/7 intubated   PAST MEDICAL HISTORY    :  Past Medical History  Diagnosis Date  . Hypothyroidism   . Depression   . Anxiety    Past Surgical History  Procedure Laterality Date  . Cosmetic surgery     Prior to Admission medications   Medication Sig Start Date End Date Taking? Authorizing Provider  buPROPion (WELLBUTRIN XL) 150 MG 24 hr tablet Take 3 tablets (450 mg total) by mouth daily. 03/14/15  Yes Marjie Skiff, MD  levothyroxine (SYNTHROID, LEVOTHROID) 150 MCG tablet Take 150 mcg by mouth daily before breakfast.   Yes Historical Provider, MD  Norgestimate-Ethinyl Estradiol Triphasic (ORTHO TRI-CYCLEN LO) 0.18/0.215/0.25 MG-25 MCG tab Take 1 tablet by mouth daily.   Yes Historical Provider, MD  traZODone (DESYREL) 50 MG tablet Take 50-100 mg by mouth at bedtime as needed for sleep.   Yes Historical Provider, MD   No Known Allergies   FAMILY HISTORY   Family History  Problem Relation Age of Onset  . Depression Mother   . Drug abuse Mother   . Heart disease Father   . Alcohol abuse Father   . Depression Father   . Diabetes Father   . Thyroid disease Father   . Depression Sister   . Polycystic ovary syndrome Sister       SOCIAL HISTORY    reports that she has never smoked. She has never used smokeless  tobacco. She reports that she drinks about 0.6 oz of alcohol per week. She reports that she does not use illicit drugs.  Review of Systems  Unable to perform ROS: critical illness      VITAL SIGNS    Temp:  [95.2 F (35.1 C)-100.9 F (38.3 C)] 100.9 F (38.3 C) (10/08 1000) Pulse Rate:  [101-123] 115 (10/08 1000) Resp:  [3-28] 21 (10/08 1000) BP: (97-118)/(63-82) 117/72 mmHg (10/08 1000) SpO2:  [96 %-100 %] 97 % (10/08 1000) FiO2 (%):  [30 %-40 %] 30 % (10/08 0825) Weight:  [164 lb 4.8 oz (74.526 kg)] 164 lb 4.8 oz (74.526 kg) (10/07 1756) HEMODYNAMICS:   VENTILATOR SETTINGS: Vent Mode:  [-] PRVC FiO2 (%):  [30 %-40 %] 30 % Set Rate:  [20 bmp] 20 bmp Vt Set:  [450 mL] 450 mL PEEP:  [5 cmH20] 5 cmH20 INTAKE / OUTPUT:  Intake/Output Summary (Last 24 hours) at 05/07/15 1037 Last data filed at 05/07/15 0715  Gross per 24 hour  Intake 422.39 ml  Output    650 ml  Net -227.61 ml       PHYSICAL EXAM   Physical Exam  Constitutional: She appears distressed.  HENT:  Head: Normocephalic and atraumatic.  Eyes: Pupils are equal, round, and reactive to light. No scleral icterus.  Neck: Normal range of motion. Neck supple.  Cardiovascular: Normal rate and  regular rhythm.   No murmur heard. Pulmonary/Chest: No respiratory distress. She has no wheezes. She has rales.  resp distress  Abdominal: Soft. She exhibits no distension. There is no tenderness.  Musculoskeletal: She exhibits no edema.  Neurological: She displays normal reflexes. Coordination normal.  gcs<8T  Skin: Skin is warm. No rash noted. She is diaphoretic.       LABS   LABS:  CBC  Recent Labs Lab 05/30/2015 1744 05/07/15 0619  WBC 10.4 19.2*  HGB 14.2 13.3  HCT 43.3 40.7  PLT 298 271   Coag's No results for input(s): APTT, INR in the last 168 hours. BMET  Recent Labs Lab 05/08/2015 1744 05/07/15 0619  NA 143 143  K 2.9* 3.7  CL 111 112*  CO2 19* 22  BUN 12 13  CREATININE 0.86 0.77    GLUCOSE 114* 98   Electrolytes  Recent Labs Lab 05/05/15 2100 05/14/2015 1744 05/07/15 0619  CALCIUM  --  8.6* 8.5*  MG 2.0  --   --    Sepsis Markers No results for input(s): LATICACIDVEN, PROCALCITON, O2SATVEN in the last 168 hours. ABG  Recent Labs Lab 05/07/2015 2005 05/07/15 0440  PHART 7.39 7.40  PCO2ART 34 35  PO2ART 153* 114*   Liver Enzymes  Recent Labs Lab 05/22/2015 1744  AST 30  ALT 30  ALKPHOS 51  BILITOT 1.0  ALBUMIN 4.3   Cardiac Enzymes No results for input(s): TROPONINI, PROBNP in the last 168 hours. Glucose No results for input(s): GLUCAP in the last 168 hours.   Recent Results (from the past 240 hour(s))  MRSA PCR Screening     Status: None   Collection Time: 05/11/2015 11:05 PM  Result Value Ref Range Status   MRSA by PCR NEGATIVE NEGATIVE Final    Comment:        The GeneXpert MRSA Assay (FDA approved for NASAL specimens only), is one component of a comprehensive MRSA colonization surveillance program. It is not intended to diagnose MRSA infection nor to guide or monitor treatment for MRSA infections.      Current facility-administered medications:  .  antiseptic oral rinse solution (CORINZ), 7 mL, Mouth Rinse, QID, Vaughan Basta, MD, 7 mL at 05/07/15 0422 .  chlorhexidine gluconate (PERIDEX) 0.12 % solution 15 mL, 15 mL, Mouth Rinse, BID, Vaughan Basta, MD, 15 mL at 05/07/15 0752 .  enoxaparin (LOVENOX) injection 40 mg, 40 mg, Subcutaneous, Q24H, Vaughan Basta, MD, 40 mg at 05/07/15 0032 .  [START ON 05/08/2015] Influenza vac split quadrivalent PF (FLUARIX) injection 0.5 mL, 0.5 mL, Intramuscular, Tomorrow-1000, Aldean Jewett, MD .  LORazepam (ATIVAN) injection 2 mg, 2 mg, Intravenous, Q1H PRN, Aldean Jewett, MD, 2 mg at 05/07/15 1030 .  pantoprazole (PROTONIX) injection 40 mg, 40 mg, Intravenous, Q24H, Colbert Coyer, MD, 40 mg at 05/07/15 0032 .  propofol (DIPRIVAN) 1000 MG/100ML infusion, 5-80  mcg/kg/min, Intravenous, Titrated, Lytle Butte, MD, Last Rate: 29.1 mL/hr at 05/07/15 0800, 65 mcg/kg/min at 05/07/15 0800  IMAGING    Dg Chest Port 1 View  05/07/2015   CLINICAL DATA:  Respiratory failure requiring intubation  EXAM: PORTABLE CHEST - 1 VIEW  COMPARISON:  the previous day's study  FINDINGS: Endotracheal tube is been partially retracted, tip proximally 3.7 cm above carina. Slightly improved aeration. Minimal residual left infrahilar atelectasis or infiltrate. Lungs otherwise clear. No pneumothorax. Heart size normal. No effusion. Visualized skeletal structures are unremarkable.  IMPRESSION: Endotracheal tube positioning as above with improved aeration.  Electronically Signed   By: Lucrezia Europe M.D.   On: 05/07/2015 09:03   Dg Chest Portable 1 View  05/03/2015   CLINICAL DATA:  Seizure, duration 30 seconds.  Post intubation.  EXAM: PORTABLE CHEST 1 VIEW  COMPARISON:  None  FINDINGS: The endotracheal tube is 2.1 cm above the carina. The lungs are grossly clear. No pneumothorax or pneumomediastinum is evident. No large effusion is evident.  IMPRESSION: Satisfactory ET tube position.  The lungs are grossly clear.   Electronically Signed   By: Andreas Newport M.D.   On: 05/10/2015 19:33      Indwelling Urinary Catheter continued, requirement due to   Reason to continue Indwelling Urinary Catheter for strict Intake/Output monitoring for hemodynamic instability         Ventilator continued, requirement due to, resp failure    Ventilator Sedation RASS 0 to -2   MAJOR EVENTS/TEST RESULTS:   INDWELLING DEVICES::  MICRO DATA: MRSA PCR >>neg Urine  Blood Resp   ANTIMICROBIALS:     ASSESSMENT/PLAN  39 yo white female with acute resp failure from acute encephalopathy from acute drug OD/suicide attempt with possible seizures  PULMONARY -Respiratory Failure -continue Full MV support -continue Bronchodilator Therapy -Wean Fio2 and PEEP as tolerated -will perform  SAT/SBt when respiratory parameters are met   CARDIOVASCULAR Needs ICU monitoring  RENAL Needs foley Follow chem 7  GASTROINTESTINAL -PPI for Gi prophylaxis  HEMATOLOGIC Follow h/h  INFECTIOUS -not on abx  ENDOCRINE - ICU hypoglycemic\Hyperglycemia protocol   NEUROLOGIC - intubated and sedated - minimal sedation to achieve a RASS goal: -1 -obtain CT head -obtain Neuro consult -EEG    I have personally obtained a history, examined the patient, evaluated laboratory and independently reviewed  imaging results, formulated the assessment and plan and placed orders.  The Patient requires high complexity decision making for assessment and support, frequent evaluation and titration of therapies, application of advanced monitoring technologies and extensive interpretation of multiple databases. Critical Care Time devoted to patient care services described in this note is 45 minutes.   Overall, patient is critically ill, prognosis is guarded. Patient at high risk for cardiac arrest and death.    Corrin Parker, M.D.  [facility]  Medical Director [place] Director [facility] Cardio-Pulmonary Department
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
## 2015-05-07 NOTE — Progress Notes (Signed)
Initial Nutrition Assessment    INTERVENTION:   Coordination of Care: discussed nutritional poc with MD Kasa, plan to hold off on initiation of EN today as possible extubation. Recommend initiation of EN within 24-48 hours if unable to extubate   NUTRITION DIAGNOSIS:   Inadequate oral intake related to acute illness as evidenced by NPO status.  GOAL:   Provide needs based on ASPEN/SCCM guidelines  MONITOR:    (Energy Intake, Anthropometrics, Electrolyte/Renal Profile, Glucose Profile, Digestive System)  REASON FOR ASSESSMENT:   Ventilator    ASSESSMENT:    Pt admitted s/p intentional overdose on welbutrin, currently on vent, possible seizures  Past Medical History  Diagnosis Date  . Hypothyroidism   . Depression   . Anxiety      Diet Order:  Diet NPO time specified   Food and Nutrition Related History: unable to assess  Electrolyte and Renal Profile:  Recent Labs Lab 05/05/15 2100 05/18/2015 1744 05/07/15 0619  BUN  --  12 13  CREATININE  --  0.86 0.77  NA  --  143 143  K  --  2.9* 3.7  MG 2.0  --   --    Glucose Profile: No results for input(s): GLUCAP in the last 72 hours.  Meds: diprivan  Digestive System: no OG or NG at present per [facility]  Height:   Ht Readings from Last 1 Encounters:  05/09/2015  (1.651 m)    Weight: weight relatively stable, weight gain noted per weight encounters  Wt Readings from Last 1 Encounters:  05/15/2015 164 lb 4.8 oz (74.526 kg)    Wt Readings from Last 10 Encounters:  05/27/2015 164 lb 4.8 oz (74.526 kg)  03/11/15 146 lb 6.4 oz (66.407 kg)  03/01/15 140 lb (63.504 kg)  02/28/15 150 lb (68.04 kg)    BMI:  Body mass index is 27.34 kg/(m^2).  Estimated Nutritional Needs:   Kcal:  assess using [employer] equation on follow-up  Protein:  90-150 g (1.2-2.0 g/kg)   Fluid:  1875-2250 mL (25-30 ml/kg)   HIGH Care Level  Romelle Starcher MS, RD, LDN [phone] Pager

## 2015-05-07 NOTE — Progress Notes (Signed)
[facility] - [place] at [facility]   PATIENT NAME: Kathleen Gray    MR#:  [id number]  DATE OF BIRTH:  [birth year]  SUBJECTIVE:  CHIEF COMPLAINT:   Chief Complaint  Patient presents with  . Suicidal  . Drug Overdose   Sedated on ventilator. Opens eyes to voice, does not follow comands  REVIEW OF SYSTEMS:   ROS unable to obtain  DRUG ALLERGIES:  No Known Allergies  VITALS:  Blood pressure 112/76, pulse 113, temperature 100.9 F (38.3 C), temperature source Rectal, resp. rate 20, height  (1.651 m), weight 74.526 kg (164 lb 4.8 oz), SpO2 99 %.  PHYSICAL EXAMINATION:  GENERAL:  39 y.o.-year-old patient lying in the bed, sedated on ventilator EYES: Pupils equal, round, reactive to light and accommodation. No scleral icterus. Extraocular muscles intact.  HEENT: Head atraumatic, normocephalic.  LUNGS: Normal breath sounds bilaterally, no wheezing, rales,rhonchi or crepitation. No use of accessory muscles of respiration.  CARDIOVASCULAR: S1, S2 normal. No murmurs, rubs, or gallops.  ABDOMEN: Soft, nontender, nondistended. Bowel sounds present. No organomegaly or mass.  EXTREMITIES: No pedal edema, cyanosis, or clubbing.  NEUROLOGIC: sedated for ventilation PSYCHIATRIC: sedated  SKIN: No obvious rash, lesion, or ulcer.   LABORATORY PANEL:   CBC  Recent Labs Lab 05/07/15 0619  WBC 19.2*  HGB 13.3  HCT 40.7  PLT 271   ------------------------------------------------------------------------------------------------------------------  Chemistries   Recent Labs Lab 05/05/15 2100  05/18/2015 1744 05/07/15 0619  NA  --   < > 143 143  K  --   < > 2.9* 3.7  CL  --   < > 111 112*  CO2  --   < > 19* 22  GLUCOSE  --   < > 114* 98  BUN  --   < > 12 13  CREATININE  --   < > 0.86 0.77  CALCIUM  --   < > 8.6* 8.5*  MG 2.0  --   --   --   AST  --   --  30  --   ALT  --   --  30  --   ALKPHOS  --   --  51  --   BILITOT  --   --  1.0  --   < > =  values in this interval not displayed. ------------------------------------------------------------------------------------------------------------------  Cardiac Enzymes No results for input(s): TROPONINI in the last 168 hours. ------------------------------------------------------------------------------------------------------------------  RADIOLOGY:  Ct Head Wo Contrast  05/07/2015   CLINICAL DATA:  Delayed recovery after sedation.  Seizure activity.  EXAM: CT HEAD WITHOUT CONTRAST  TECHNIQUE: Contiguous axial images were obtained from the base of the skull through the vertex without intravenous contrast.  COMPARISON:  None.  FINDINGS: The brain has a normal appearance without evidence of malformation, atrophy, old or acute infarction, mass lesion, hemorrhage, hydrocephalus or extra-axial collection. The calvarium is unremarkable. The paranasal sinuses, middle ears and mastoids are clear.  IMPRESSION: Normal head CT   Electronically Signed   By: Paulina Fusi M.D.   On: 05/07/2015 11:40   Dg Chest Port 1 View  05/07/2015   CLINICAL DATA:  Respiratory failure requiring intubation  EXAM: PORTABLE CHEST - 1 VIEW  COMPARISON:  the previous day's study  FINDINGS: Endotracheal tube is been partially retracted, tip proximally 3.7 cm above carina. Slightly improved aeration. Minimal residual left infrahilar atelectasis or infiltrate. Lungs otherwise clear. No pneumothorax. Heart size normal. No effusion. Visualized skeletal structures are unremarkable.  IMPRESSION: Endotracheal  tube positioning as above with improved aeration.   Electronically Signed   By: Corlis Leak M.D.   On: 05/07/2015 09:03   Dg Chest Portable 1 View  05/30/2015   CLINICAL DATA:  Seizure, duration 30 seconds.  Post intubation.  EXAM: PORTABLE CHEST 1 VIEW  COMPARISON:  None  FINDINGS: The endotracheal tube is 2.1 cm above the carina. The lungs are grossly clear. No pneumothorax or pneumomediastinum is evident. No large effusion is  evident.  IMPRESSION: Satisfactory ET tube position.  The lungs are grossly clear.   Electronically Signed   By: Ellery Plunk M.D.   On: 05/15/2015 19:33    EKG:   Orders placed or performed during the hospital encounter of 05/05/2015  . ED EKG  . ED EKG    ASSESSMENT AND PLAN:   1) seizure due to bupropion overdose - neurology consultation pending, EEG pending - sedation changed from propofol to versed, ativan for seizure - continue on ventilator support - CT head negative  2) bupropion overdose - no events on telemetry, continue to monitor - poison control following  3) leucocytosis with fever -  likely due to seizure - obtain blood cultures and UA/urine culture  4) suicide attempt - only contact listed is a friend.  - social work consult. Will need assistance finding family.  CODE STATUS: full  TOTAL TIME TAKING CARE OF THIS PATIENT: 30 minutes.  Greater than 50% of time spent in care coordination and counseling. POSSIBLE D/C IN ? DAYS, DEPENDING ON CLINICAL CONDITION.   Elby Showers M.D on 05/07/2015 at 1:13 PM  Between 7am to 6pm - Pager - [phone]  After 6pm go to [email or web address] - password [email or web address] [facility]  [place] [facility]  Office  [phone]  CC: Primary care physician; No PCP Per Patient

## 2015-05-07 NOTE — Progress Notes (Signed)
Attempted to do wake up assessment.  Propofol turned completely off.  Pt did wake up but would not follow commands.  Had jerky movements throughout wake up assessment, nothing voluntary noted.  Ultimately displayed short tonic seizure, witnessed, lasted <30 secs.  Dr. Belia Heman present.  Pt resedated.  Planned for CT of head as well as Neuro consult to be added.   of Ativan given   Morning rounding physician advised of elevated WBC, inc temp and night seizure.  Orders for Urine cultures and bld cultures obtained and collected.    Poison control called for update.  Given information on above listed topics.  Stated they will call back for further updates.

## 2015-05-08 ENCOUNTER — Inpatient Hospital Stay
Admit: 2015-05-08 | Discharge: 2015-05-08 | Disposition: A | Discharge status: Home / Self Care | Payer: Medicaid Other | Attending: Internal Medicine | Admitting: Internal Medicine

## 2015-05-08 ENCOUNTER — Inpatient Hospital Stay: Payer: Medicaid Other

## 2015-05-08 ENCOUNTER — Inpatient Hospital Stay
Admission: AD | Admit: 2015-05-08 | Payer: Self-pay | Source: Other Acute Inpatient Hospital | Admitting: Internal Medicine

## 2015-05-08 DIAGNOSIS — J9690 Respiratory failure, unspecified, unspecified whether with hypoxia or hypercapnia: Secondary | ICD-10-CM | POA: Insufficient documentation

## 2015-05-08 DIAGNOSIS — G40901 Epilepsy, unspecified, not intractable, with status epilepticus: Secondary | ICD-10-CM

## 2015-05-08 DIAGNOSIS — R569 Unspecified convulsions: Secondary | ICD-10-CM

## 2015-05-08 DIAGNOSIS — T50902A Poisoning by unspecified drugs, medicaments and biological substances, intentional self-harm, initial encounter: Secondary | ICD-10-CM | POA: Insufficient documentation

## 2015-05-08 LAB — CBC WITH DIFFERENTIAL/PLATELET
Basophils Absolute: 0.1 10*3/uL (ref 0–0.1)
Basophils Relative: 0 %
Eosinophils Absolute: 0.1 10*3/uL (ref 0–0.7)
Eosinophils Relative: 0 %
HCT: 32.9 % — ABNORMAL LOW (ref 35.0–47.0)
Hemoglobin: 10.7 g/dL — ABNORMAL LOW (ref 12.0–16.0)
Lymphocytes Relative: 4 %
Lymphs Abs: 0.9 10*3/uL — ABNORMAL LOW (ref 1.0–3.6)
MCH: 28.7 pg (ref 26.0–34.0)
MCHC: 32.4 g/dL (ref 32.0–36.0)
MCV: 88.4 fL (ref 80.0–100.0)
Monocytes Absolute: 0.8 10*3/uL (ref 0.2–0.9)
Monocytes Relative: 4 %
Neutro Abs: 17.6 10*3/uL — ABNORMAL HIGH (ref 1.4–6.5)
Neutrophils Relative %: 92 %
Platelets: 212 10*3/uL (ref 150–440)
RBC: 3.72 MIL/uL — ABNORMAL LOW (ref 3.80–5.20)
RDW: 13.4 % (ref 11.5–14.5)
WBC: 19.5 10*3/uL — ABNORMAL HIGH (ref 3.6–11.0)

## 2015-05-08 LAB — BLOOD GAS, ARTERIAL
ACID-BASE DEFICIT: 9.8 mmol/L — AB (ref 0.0–2.0)
BICARBONATE: 18.7 meq/L — AB (ref 21.0–28.0)
FIO2: 1
LHR: 20 {breaths}/min
Mechanical Rate: 20
O2 SAT: 73.8 %
PCO2 ART: 50 mmHg — AB (ref 32.0–48.0)
PEEP/CPAP: 5 cmH2O
PH ART: 7.18 — AB (ref 7.350–7.450)
Patient temperature: 37
VT: 450 mL
pO2, Arterial: 50 mmHg — ABNORMAL LOW (ref 83.0–108.0)

## 2015-05-08 LAB — BASIC METABOLIC PANEL
ANION GAP: 9 (ref 5–15)
BUN: 11 mg/dL (ref 6–20)
CALCIUM: 8.1 mg/dL — AB (ref 8.9–10.3)
CO2: 22 mmol/L (ref 22–32)
Chloride: 114 mmol/L — ABNORMAL HIGH (ref 101–111)
Creatinine, Ser: 0.91 mg/dL (ref 0.44–1.00)
GLUCOSE: 84 mg/dL (ref 65–99)
Potassium: 4.2 mmol/L (ref 3.5–5.1)
SODIUM: 145 mmol/L (ref 135–145)

## 2015-05-08 LAB — COMPREHENSIVE METABOLIC PANEL
ALK PHOS: 54 U/L (ref 38–126)
ALT: 1093 U/L — AB (ref 14–54)
AST: 1025 U/L — AB (ref 15–41)
Albumin: 3 g/dL — ABNORMAL LOW (ref 3.5–5.0)
Anion gap: 9 (ref 5–15)
BUN: 16 mg/dL (ref 6–20)
CALCIUM: 6.7 mg/dL — AB (ref 8.9–10.3)
CHLORIDE: 105 mmol/L (ref 101–111)
CO2: 32 mmol/L (ref 22–32)
CREATININE: 1.35 mg/dL — AB (ref 0.44–1.00)
GFR calc Af Amer: 57 mL/min — ABNORMAL LOW (ref >60)
GFR calc non Af Amer: 49 mL/min — ABNORMAL LOW (ref >60)
Glucose, Bld: 135 mg/dL — ABNORMAL HIGH (ref 65–99)
Potassium: 2.6 mmol/L — CL (ref 3.5–5.1)
SODIUM: 146 mmol/L — AB (ref 135–145)
Total Bilirubin: 2.2 mg/dL — ABNORMAL HIGH (ref 0.3–1.2)
Total Protein: 5.3 g/dL — ABNORMAL LOW (ref 6.5–8.1)

## 2015-05-08 LAB — CBC
HCT: 36.2 % (ref 35.0–47.0)
HEMOGLOBIN: 12 g/dL (ref 12.0–16.0)
MCH: 28.5 pg (ref 26.0–34.0)
MCHC: 33.2 g/dL (ref 32.0–36.0)
MCV: 85.8 fL (ref 80.0–100.0)
PLATELETS: 258 10*3/uL (ref 150–440)
RBC: 4.23 MIL/uL (ref 3.80–5.20)
RDW: 12.9 % (ref 11.5–14.5)
WBC: 23.7 10*3/uL — AB (ref 3.6–11.0)

## 2015-05-08 LAB — HEPATIC FUNCTION PANEL
ALBUMIN: 3.1 g/dL — AB (ref 3.5–5.0)
ALK PHOS: 52 U/L (ref 38–126)
ALT: 1044 U/L — AB (ref 14–54)
AST: 994 U/L — ABNORMAL HIGH (ref 15–41)
Bilirubin, Direct: 0.8 mg/dL — ABNORMAL HIGH (ref 0.1–0.5)
Indirect Bilirubin: 1.1 mg/dL — ABNORMAL HIGH (ref 0.3–0.9)
TOTAL PROTEIN: 5.2 g/dL — AB (ref 6.5–8.1)
Total Bilirubin: 1.9 mg/dL — ABNORMAL HIGH (ref 0.3–1.2)

## 2015-05-08 LAB — GLUCOSE, CAPILLARY
GLUCOSE-CAPILLARY: 94 mg/dL (ref 65–99)
Glucose-Capillary: 95 mg/dL (ref 65–99)
Glucose-Capillary: 118 mg/dL — ABNORMAL HIGH (ref 65–99)

## 2015-05-08 LAB — PROTIME-INR
INR: 1.22
Prothrombin Time: 15.6 seconds — ABNORMAL HIGH (ref 11.4–15.0)

## 2015-05-08 LAB — APTT: aPTT: 33 seconds (ref 24–36)

## 2015-05-08 LAB — MAGNESIUM: Magnesium: 2 mg/dL (ref 1.7–2.4)

## 2015-05-08 LAB — LACTIC ACID, PLASMA: Lactic Acid, Venous: 3.8 mmol/L (ref 0.5–2.0)

## 2015-05-08 MED ORDER — SODIUM CHLORIDE 0.9 % IV SOLN
1000.0000 mg | Freq: Two times a day (BID) | INTRAVENOUS | Status: DC
  Administered 2015-05-08 – 2015-05-13 (×10): 1000 mg via INTRAVENOUS
  Filled 2015-05-08 (×11): qty 10

## 2015-05-08 MED ORDER — PIPERACILLIN-TAZOBACTAM 3.375 G IVPB
3.3750 g | Freq: Three times a day (TID) | INTRAVENOUS | Status: DC
  Administered 2015-05-09 (×2): 3.375 g via INTRAVENOUS
  Filled 2015-05-08 (×4): qty 50

## 2015-05-08 MED ORDER — SODIUM CHLORIDE 0.9 % IJ SOLN: 10.0000 mL | INTRAMUSCULAR | Status: DC | PRN

## 2015-05-08 MED ORDER — NOREPINEPHRINE 4 MG/250ML-% IV SOLN
INTRAVENOUS | Status: AC
  Administered 2015-05-08: 4 mg
  Filled 2015-05-08: qty 250

## 2015-05-08 MED ORDER — ACETAMINOPHEN 10 MG/ML IV SOLN
1000.0000 mg | Freq: Once | INTRAVENOUS | Status: AC
  Administered 2015-05-08: 1000 mg via INTRAVENOUS
  Filled 2015-05-08: qty 100

## 2015-05-08 MED ORDER — SODIUM CHLORIDE 0.9 % IV SOLN
INTRAVENOUS | Status: DC
  Filled 2015-05-08 (×6): qty 400

## 2015-05-08 MED ORDER — POTASSIUM CHLORIDE 2 MEQ/ML IV SOLN
Freq: Once | INTRAVENOUS | Status: AC
  Administered 2015-05-08: 18:00:00 via INTRAVENOUS
  Filled 2015-05-08: qty 400

## 2015-05-08 MED ORDER — SODIUM BICARBONATE 8.4 % IV SOLN
INTRAVENOUS | Status: DC
  Administered 2015-05-08: 11:00:00 via INTRAVENOUS
  Filled 2015-05-08 (×2): qty 150

## 2015-05-08 MED ORDER — ACETAMINOPHEN 650 MG RE SUPP
650.0000 mg | RECTAL | Status: DC | PRN
  Administered 2015-05-08 – 2015-05-11 (×4): 650 mg via RECTAL
  Filled 2015-05-08 (×4): qty 1

## 2015-05-08 MED ORDER — MIDAZOLAM BOLUS VIA INFUSION
5.0000 mg | Freq: Once | INTRAVENOUS | Status: AC
  Administered 2015-05-08: 5 mg via INTRAVENOUS
  Filled 2015-05-08: qty 5

## 2015-05-08 MED ORDER — POTASSIUM CHLORIDE 10 MEQ/100ML IV SOLN: 10.0000 meq | INTRAVENOUS | Status: DC

## 2015-05-08 MED ORDER — SODIUM CHLORIDE 0.9 % IV SOLN
1000.0000 mg | Freq: Once | INTRAVENOUS | Status: AC
  Administered 2015-05-08: 1000 mg via INTRAVENOUS
  Filled 2015-05-08: qty 10

## 2015-05-08 MED ORDER — VASOPRESSIN 20 UNIT/ML IV SOLN
0.0300 [IU]/min | INTRAVENOUS | Status: DC
  Administered 2015-05-08 – 2015-05-09 (×2): 0.03 [IU]/min via INTRAVENOUS
  Filled 2015-05-08 (×2): qty 2

## 2015-05-08 MED ORDER — EPINEPHRINE HCL 1 MG/ML IJ SOLN
0.5000 ug/min | INTRAVENOUS | Status: DC
  Administered 2015-05-08: 40 ug/min via INTRAVENOUS
  Administered 2015-05-08: 15 ug/min via INTRAVENOUS
  Filled 2015-05-08 (×3): qty 4

## 2015-05-08 MED ORDER — VANCOMYCIN HCL IN DEXTROSE 750-5 MG/150ML-% IV SOLN
750.0000 mg | Freq: Three times a day (TID) | INTRAVENOUS | Status: DC
  Administered 2015-05-09: 750 mg via INTRAVENOUS
  Filled 2015-05-08 (×4): qty 150

## 2015-05-08 MED ORDER — NOREPINEPHRINE BITARTRATE 1 MG/ML IV SOLN
0.0000 ug/min | INTRAVENOUS | Status: DC
  Administered 2015-05-08: 25 ug/min via INTRAVENOUS

## 2015-05-08 MED ORDER — NOREPINEPHRINE 4 MG/250ML-% IV SOLN
INTRAVENOUS | Status: AC
  Administered 2015-05-08: 25 ug/min via INTRAVENOUS
  Filled 2015-05-08: qty 250

## 2015-05-08 MED ORDER — NOREPINEPHRINE 4 MG/250ML-% IV SOLN
0.0000 ug/min | INTRAVENOUS | Status: DC
  Administered 2015-05-08: 10 ug/min via INTRAVENOUS
  Administered 2015-05-08: 25 ug/min via INTRAVENOUS
  Administered 2015-05-08: 20 ug/min via INTRAVENOUS
  Administered 2015-05-09: 8 ug/min via INTRAVENOUS
  Filled 2015-05-08 (×2): qty 250

## 2015-05-08 MED ORDER — DOPAMINE-DEXTROSE 3.2-5 MG/ML-% IV SOLN
0.0000 ug/kg/min | INTRAVENOUS | Status: DC
  Administered 2015-05-08: 20 ug/kg/min via INTRAVENOUS
  Administered 2015-05-08: 40 ug/kg/min via INTRAVENOUS
  Filled 2015-05-08: qty 250

## 2015-05-08 MED ORDER — VECURONIUM BROMIDE 10 MG IV SOLR
INTRAVENOUS | Status: AC
  Administered 2015-05-08: 10 mg
  Filled 2015-05-08: qty 10

## 2015-05-08 MED ORDER — VANCOMYCIN HCL 10 G IV SOLR
1750.0000 mg | Freq: Once | INTRAVENOUS | Status: AC
  Administered 2015-05-08: 1750 mg via INTRAVENOUS
  Filled 2015-05-08: qty 1750

## 2015-05-08 MED ORDER — LORAZEPAM 2 MG/ML IJ SOLN
4.0000 mg | Freq: Once | INTRAMUSCULAR | Status: AC
  Administered 2015-05-08: 4 mg via INTRAVENOUS

## 2015-05-08 MED ORDER — SODIUM CHLORIDE 0.9 % IV BOLUS (SEPSIS)
1000.0000 mL | INTRAVENOUS | Status: AC
  Administered 2015-05-08 (×2): 1000 mL via INTRAVENOUS

## 2015-05-08 MED FILL — Medication: Qty: 1 | Status: AC

## 2015-05-08 NOTE — Procedures (Signed)
Arterial Line Placement: Indication: Frequent blood draws; Invasive BP monitoring.   Consent: Emergent.   Risks and benefits explained to patient and/or family in detail including risk of infection, bleeding, respiratory failure and death..   Hand washing performed prior to starting the procedure.   Procedure: An active timeout was performed and correct patient, name, & ID confirmed. Physicial exam was performed to ensure adequate perfusion.  Using sterile technique, an aterial line was inserted into the RIGHT Femoral artery artery.  Catheter threaded and the needle was removed with appropriate blood return.  Arterial waveform was noted.  After the procedure, the patient's extremities were observed to be pink and warm.   Estimated Blood Loss: None .   Number of Attempts: 1.   Complications: None .  Operator: Amier Hoyt.   Destin Kittler David Jakwon Gayton, M.D.  [facility]  Medical Director ICU-[facility] [place] Medical Director [facility] Cardio-Pulmonary Department      

## 2015-05-08 NOTE — Progress Notes (Signed)
[place] at [place] NAME: Kathleen Gray    MR#:  [id number]  DATE OF BIRTH:  1976/02/22  SUBJECTIVE:  CHIEF COMPLAINT:   Chief Complaint  Patient presents with  . Suicidal  . Drug Overdose   Multiple seizures overnight. Code blue this morning for 5-10 minutes of chest compressions.   REVIEW OF SYSTEMS:   ROS unable to obtain  DRUG ALLERGIES:  No Known Allergies  VITALS:  Blood pressure 126/93, pulse 105, temperature 100.9 F (38.3 C), temperature source Oral, resp. rate 26, height _0  (1.651 m), weight 74.526 kg (164 lb 4.8 oz), SpO2 100 %.  PHYSICAL EXAMINATION:  GENERAL:  39 y.o.-year-old patient lying in the bed, sedated on ventilator EYES: Pupils equal, round, reactive to light and accommodation. No scleral icterus. Extraocular muscles intact.  HEENT: Head atraumatic, normocephalic.  LUNGS: Normal breath sounds bilaterally, no wheezing, rales,rhonchi or crepitation. No use of accessory muscles of respiration.  CARDIOVASCULAR: S1, S2 normal. No murmurs, rubs, or gallops.  ABDOMEN: Soft, nontender, nondistended. Bowel sounds present. No organomegaly or mass.  EXTREMITIES: No pedal edema, cyanosis, or clubbing.  NEUROLOGIC: sedated for ventilation PSYCHIATRIC: sedated  SKIN: No obvious rash, lesion, or ulcer.   LABORATORY PANEL:   CBC  Recent Labs Lab 05/08/15 0508  WBC 19.5*  HGB 10.7*  HCT 32.9*  PLT 212   ------------------------------------------------------------------------------------------------------------------  Chemistries   Recent Labs Lab 05/16/2015 1744  05/08/15 0350 05/08/15 0508  NA 143  < > 145  --   K 2.9*  < > 4.2  --   CL 111  < > 114*  --   CO2 19*  < > 22  --   GLUCOSE 114*  < > 84  --   BUN 12  < > 11  --   CREATININE 0.86  < > 0.91  --   CALCIUM 8.6*  < > 8.1*  --   MG  --   --   --  2.0  AST 30  --   --   --   ALT 30  --   --   --   ALKPHOS 51  --   --   --   BILITOT 1.0   --   --   --   < > = values in this interval not displayed. ------------------------------------------------------------------------------------------------------------------  Cardiac Enzymes No results for input(s): TROPONINI in the last 168 hours. ------------------------------------------------------------------------------------------------------------------  RADIOLOGY:  Ct Head Wo Contrast  05/07/2015   CLINICAL DATA:  Delayed recovery after sedation.  Seizure activity.  EXAM: CT HEAD WITHOUT CONTRAST  TECHNIQUE: Contiguous axial images were obtained from the base of the skull through the vertex without intravenous contrast.  COMPARISON:  None.  FINDINGS: The brain has a normal appearance without evidence of malformation, atrophy, old or acute infarction, mass lesion, hemorrhage, hydrocephalus or extra-axial collection. The calvarium is unremarkable. The paranasal sinuses, middle ears and mastoids are clear.  IMPRESSION: Normal head CT   Electronically Signed   By: Nelson Chimes M.D.   On: 05/07/2015 11:40   Dg Chest Port 1 View  05/08/2015   CLINICAL DATA:  Rule out pneumothorax, status post resuscitation.  EXAM: PORTABLE CHEST 1 VIEW  COMPARISON:  05/08/2015  FINDINGS: Right-sided internal jugular approach central venous catheter, endotracheal tube, and shock pads are in stable position.  Cardiomediastinal silhouette is normal. Mediastinal contours appear intact.  There is no evidence of pneumothorax. There are diffusely increased interstitial markings, with  possible development of airspace consolidation in the right lower lobe. There is probable left lower lobe atelectasis.  Osseous structures are without acute abnormality. Soft tissues are grossly normal.  IMPRESSION: Stable positioning of supporting lines and tubes.  Interval development of increased interstitial markings, suggestive of developing pulmonary edema.  Possible development of right lower lobe airspace consolidation, and left lower  lobe atelectasis.   Electronically Signed   By: Fidela Salisbury M.D.   On: 05/08/2015 10:36   Dg Chest Port 1 View  05/08/2015   CLINICAL DATA:  Respiratory failure and status post central line placement.  EXAM: PORTABLE CHEST 1 VIEW  COMPARISON:  05/07/2015  FINDINGS: Interval placement of right jugular central line with the catheter tip at the cavoatrial junction. Endotracheal tube remains with the tip approximately 1.5 cm above the carina. No pneumothorax identified. Lungs show increased bilateral lower lobe atelectasis, left greater than right. There may be a developing infiltrate in the left lower lung. No overt edema or pleural fluid identified. The heart size is normal.  IMPRESSION: The rib right jugular central line tip lies at the cavoatrial junction. No pneumothorax is seen after line placement. There is increase in bilateral lower lobe atelectasis/ consolidation, left greater than right. Developing infiltrate in the left lower lung cannot be excluded.   Electronically Signed   By: Aletta Edouard M.D.   On: 05/08/2015 09:17   Dg Chest Port 1 View  05/07/2015   CLINICAL DATA:  Respiratory failure requiring intubation  EXAM: PORTABLE CHEST - 1 VIEW  COMPARISON:  the previous day's study  FINDINGS: Endotracheal tube is been partially retracted, tip proximally 3.7 cm above carina. Slightly improved aeration. Minimal residual left infrahilar atelectasis or infiltrate. Lungs otherwise clear. No pneumothorax. Heart size normal. No effusion. Visualized skeletal structures are unremarkable.  IMPRESSION: Endotracheal tube positioning as above with improved aeration.   Electronically Signed   By: Lucrezia Europe M.D.   On: 05/07/2015 09:03   Dg Chest Portable 1 View  05/24/2015   CLINICAL DATA:  Seizure, duration 30 seconds.  Post intubation.  EXAM: PORTABLE CHEST 1 VIEW  COMPARISON:  None  FINDINGS: The endotracheal tube is 2.1 cm above the carina. The lungs are grossly clear. No pneumothorax or  pneumomediastinum is evident. No large effusion is evident.  IMPRESSION: Satisfactory ET tube position.  The lungs are grossly clear.   Electronically Signed   By: Andreas Newport M.D.   On: 05/29/2015 19:33   Dg Abd Portable 1v  05/07/2015   CLINICAL DATA:  Bedside orogastric tube placement.  EXAM: PORTABLE ABDOMEN - 1 VIEW  COMPARISON:  None.  FINDINGS: OG tube tip projects at the expected location of the gastric antrum. Bowel gas pattern unremarkable.  IMPRESSION: 1. OG tube tip projects at the expected location of the gastric antrum. 2. No acute abdominal abnormality.   Electronically Signed   By: Evangeline Dakin M.D.   On: 05/07/2015 16:26    EKG:   Orders placed or performed during the hospital encounter of 05/15/2015  . ED EKG  . ED EKG    ASSESSMENT AND PLAN:   1) status epilepticus after bupropion overdose - has had several hours of tonic clonic seizure this morning despite versed, keppra, ativan.  - neurology following - attempted to transfer to [place] for continuous EEG and neuro ICU but decompensated - will repeat CT in am, EEG in am - remains on full vent support  2) Vtach cardiac arrest -  due  to lactic acidosis due to #1 -  Acidosis corrected with bicarb during resuscitation - continue ICU monitoring overnight and possible transfer in am  3) leucocytosis with fever -  likely due to seizure -  Blood and urine cultures pending - CXR with possible consolidation, start vanc/zosy for HCAP  4) anemia - hgb drop 13--10 overnight, minimal bleeding from mouth noted. Monitor closely.  5) hypotension - ECHO pending - continue pressor support  6) electrolytes per CCU replacement protocol   CODE STATUS: full  TOTAL TIME TAKING CARE OF THIS PATIENT: 60 minutes.  Greater than 50% of time spent in care coordination and counseling. Met with family multiple times for updates and care planning.  POSSIBLE D/C IN ? DAYS, DEPENDING ON CLINICAL CONDITION.   Myrtis Ser M.D on 05/08/2015 at 3:11 PM  Between 7am to 6pm - Pager - [phone]  After 6pm go to [email or web address] - password [email or web address] [facility]  [place] [facility]  Office  [phone]  CC: Primary care physician; No PCP Per Patient

## 2015-05-08 NOTE — Progress Notes (Signed)
Art Line placed to Right groin, old central line removed, New central line placed to RIJ.  Xray confirmed placement.  Transport called for report.

## 2015-05-08 NOTE — Progress Notes (Signed)
*  PRELIMINARY RESULTS* Echocardiogram 2D Echocardiogram has been performed.  Kathleen Gray 05/08/2015, 3:20 PM

## 2015-05-08 NOTE — Consult Note (Signed)
ANTIBIOTIC CONSULT NOTE - INITIAL  Pharmacy Consult for vancomycin/zosyn Indication: HCAP  No Known Allergies  Patient Measurements: Height:  (165.1 cm) Weight: 164 lb 4.8 oz (74.526 kg) IBW/kg (Calculated) : 57 Adjusted Body Weight: 64kg  Vital Signs: Temp: 100.9 F (38.3 C) (10/09 1400) Temp Source: Oral (10/09 1400) BP: 123/92 mmHg (10/09 1600) Pulse Rate: 102 (10/09 1600) Intake/Output from previous day: 10/08 0701 - 10/09 0700 In: 1036.9 [I.V.:786.9; IV Piggyback:250] Out: 975 [Urine:975] Intake/Output from this shift: Total I/O In: 2999.3 [I.V.:899.3; IV Piggyback:2100] Out: 1200 [Urine:1100; Emesis/NG output:100]  Labs:  Recent Labs  2015/05/21 1744 05/07/15 0619 05/08/15 0350 05/08/15 0508 05/08/15 1602  WBC 10.4 19.2*  --  19.5* 23.7*  HGB 14.2 13.3  --  10.7* 12.0  PLT 298 271  --  212 258  CREATININE 0.86 0.77 0.91  --   --    Estimated Creatinine Clearance: 85.5 mL/min (by C-G formula based on Cr of 0.91). No results for input(s): VANCOTROUGH, VANCOPEAK, VANCORANDOM, GENTTROUGH, GENTPEAK, GENTRANDOM, TOBRATROUGH, TOBRAPEAK, TOBRARND, AMIKACINPEAK, AMIKACINTROU, AMIKACIN in the last 72 hours.   Microbiology: Recent Results (from the past 720 hour(s))  MRSA PCR Screening     Status: None   Collection Time: 05-21-15 11:05 PM  Result Value Ref Range Status   MRSA by PCR NEGATIVE NEGATIVE Final    Comment:        The GeneXpert MRSA Assay (FDA approved for NASAL specimens only), is one component of a comprehensive MRSA colonization surveillance program. It is not intended to diagnose MRSA infection nor to guide or monitor treatment for MRSA infections.   Urine culture     Status: None (Preliminary result)   Collection Time: 05/07/15  8:32 AM  Result Value Ref Range Status   Specimen Description URINE, CATHETERIZED  Final   Special Requests Normal  Final   Culture NO GROWTH < 24 HOURS  Final   Report Status PENDING  Incomplete  Culture,  blood (routine x 2)     Status: None (Preliminary result)   Collection Time: 05/07/15  8:43 AM  Result Value Ref Range Status   Specimen Description BLOOD RIGHT AC  Final   Special Requests BOTTLES DRAWN AEROBIC AND ANAEROBIC  7CC  Final   Culture NO GROWTH < 24 HOURS  Final   Report Status PENDING  Incomplete  Culture, blood (routine x 2)     Status: None (Preliminary result)   Collection Time: 05/07/15  8:55 AM  Result Value Ref Range Status   Specimen Description BLOOD RIGHT HAND  Final   Special Requests   Final    BOTTLES DRAWN AEROBIC AND ANAEROBIC  AER 7CC ANA 10CC   Culture NO GROWTH < 24 HOURS  Final   Report Status PENDING  Incomplete    Medical History: Past Medical History  Diagnosis Date  . Hypothyroidism   . Depression   . Anxiety     Medications:  Scheduled:  . amantadine  100 mg Oral BID  . antiseptic oral rinse  7 mL Mouth Rinse QID  . chlorhexidine gluconate  15 mL Mouth Rinse BID  . enoxaparin (LOVENOX) injection  40 mg Subcutaneous Q24H  . Influenza vac split quadrivalent PF  0.5 mL Intramuscular Tomorrow-1000  . pantoprazole (PROTONIX) IV  40 mg Intravenous Q24H   Assessment: Pt is a 39 year old female who presents to hospital with a buproprion overdose. Pt has had multiple seizures and code blue the AM. Pt has leukocytosis and fever. Chest  x-ray shows possible consolidation. Per hospitalist-start vanc/zosyn for HCAP. Pharmacy consulted to dose Ke=0.075 Vd=44 T1/2=9  Goal of Therapy:  Vancomycin trough level 15-20 mcg/ml  Plan:  Will load with 1750 mg once then transition to  q 8 hours. Will measure through at steady state 0900 on 10/11. Will give zosyn 3.375mg  q 8 hours EI infusion. Pharmacy to continue to monitor renal function and cx  Olene Floss, Pharm.D Clinical Pharmacist   05/08/2015,4:28 PM

## 2015-05-08 NOTE — Progress Notes (Signed)
Contacted Art therapist at [facility] and gave report for pt.  Will notify of same when pt is transported from facility.  [phone]

## 2015-05-08 NOTE — Progress Notes (Addendum)
eLink Physician-Brief Progress Note Patient Name: Kathleen Gray DOB: 10-27-1975 MRN: [id number]   Date of Service  05/08/2015  HPI/Events of Note  Low k  eICU Interventions  Replaced by primary team per nurse.     Intervention Category Minor Interventions: Electrolytes abnormality - evaluation and management  Henry Russel, P 05/08/2015, 5:22 PM
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
## 2015-05-08 NOTE — Progress Notes (Signed)
Pt currently remains under full vent support.  Read other notes for today's activities.  Pt appears more stable, vent settings starting to be adjusted down, Levo being weaned down as well.  K being replaced currently.  Pt is beginning to respond more to stimuli then earlier s/p cardiac arrest.  No seizures s/p cardiac arrest.

## 2015-05-08 NOTE — Progress Notes (Signed)
LCSW in ED received a call from a Mr Kathleen Gray [phone]. No information was provided to this "fiance" as directed by other CSW.  Mr Kathleen Gray was advised to communicate with pts family for any and all information.

## 2015-05-08 NOTE — Progress Notes (Signed)
Transport arrived for pt, [facility] nurse was being given report when pt went into Vtach arrest.  CPR was performed by this nurse and transport team for several mins until ROSC.  Meds adjusted for pt.  Currently has Bicarb drip, Levo and Vaso running.  Plan is to monitor pt for several more hrs and contact transport if pt remains stable.

## 2015-05-08 NOTE — Progress Notes (Signed)
NEUROLOGY NOTE  S: Pt was supposed to be transferred due to multiple seizures but then had a code today which prevented this.  Pt is more alert per nursing after code and some medications have been weaned.  ROS unobtainable to mental status  O: 100.9    132/78     96    20 Moderate distress, nl weight Normocephalic, oropharynx clear Supple, no JVD CTA B, no wheezing tachycardic, no murmur no C/C/E  Intubated, lightly sedated, opens to pain but does not track of follow, GCS 7T Pupils 7mm B and nonreactive, weak corneals B, good cough Withdrawals B to pain, nl tone  A/P: 1. Possible status epilepticus- Pt still continuous to have multiple episodes even after several large doses of Versed and Ativan.  Pt is at risk for further brain damage and possibly death.  There still is no clear evidence of serotonin syndrome from this.  NMS is not typical side effect of bupriopon either.  Can have some effect from dopamine and norepinephrine though - Continue supportive care -  Will add Keppra  q12h scheduled -  Check CPK and LFTs - continue Versed /hr until EEG in am -  EEG pending  - ok to hold amantadine  BID - still recommend transfer for continuous EEG monitoring - Keep Mg > 2, Ca > 8 and Na > 130 - Will follow  40 minutes of critical care time spent, examining pt, reviewing chart and coordinating care

## 2015-05-08 NOTE — Progress Notes (Signed)
Notified Dr. Katrinka Blazing about Pt's fifth seizure in the last hour. Already given multiple doses of PRN ativan. Told him about Pt's swollen tongue and Temperature. New orders to give a  bolus of Versed and increase Versed drip to /hr. 1 gram of Keppra ordered. Orders to leave the Fentanyl drip at current rate. Will continue to monitor

## 2015-05-08 NOTE — Discharge Instructions (Signed)
Discharge to [facility] Neuro ICU

## 2015-05-08 NOTE — Progress Notes (Signed)
Patient actively having seizures. Bite block inserted in airway earlier. o2 increased to attempt to lessen o2 desaturation with seizure activity.

## 2015-05-08 NOTE — Consult Note (Signed)
S/p CODE BLUE TACH ARREST/PEA for approx 5 minutes Patient seemed to have responded to bicarb pushes/EPI pushes.   1)start levopahed. Stop dopamine and EPI drip 2)start bicarb infusion 3)await repeat ABG  Prognosis is poor. Transfer to [facility] when little bit more stable

## 2015-05-08 NOTE — Progress Notes (Signed)
Spoke with Dr. Belia Heman, pt to remain at [facility] ICU for another 24hrs, will have CT of head and EEG in am then will be transported to [facility].  Asher Muir RN at [facility] updated of same.  Family at the bedside and aware.

## 2015-05-08 NOTE — Procedures (Signed)
Central Venous Catheter Placement: Indication: Patient receiving vesicant or irritant drug.; Patient receiving intravenous therapy for longer than 5 days.; Patient has limited or no vascular access.   Consent:emergent  Risks and benefits explained in detail including risk of infection, bleeding, respiratory failure and death..   Hand washing performed prior to starting the procedure.   Procedure: An active timeout was performed and correct patient, name, & ID confirmed.  After explaining risk and benefits, patient was positioned correctly for central venous access. Patient was prepped using strict sterile technique including chlorohexadine preps, sterile drape, sterile gown and sterile gloves.  The area was prepped, draped and anesthetized in the usual sterile manner. Patient comfort was obtained.  A triple lumen catheter was placed in RT  Internal Jugular Vein There was good blood return, catheter caps were placed on lumens, catheter flushed easily, the line was secured and a sterile dressing and BIO-PATCH applied.   Ultrasound was used to visualize vasculature and guidance of needle.   Number of Attempts: 1 Complications:none Estimated Blood Loss: none Chest Radiograph indicated and ordered.  Operator: Naraya Stoneberg.   Dario Yono David Maranda Marte, M.D.  [facility]  Medical Director ICU-[facility] Medical Director [facility] Cardio-Pulmonary Department     

## 2015-05-08 NOTE — Progress Notes (Signed)
   05/08/15 1434  Clinical Encounter Type  Visited With Patient and family together  Visit Type Follow-up;Spiritual support  Referral From Chaplain  Consult/Referral To Chaplain  Spiritual Encounters  Spiritual Needs Prayer;Emotional  Visited with Pt's son and daughter. Provided spiritual/emotional support and prayer.

## 2015-05-08 NOTE — Consult Note (Signed)
[facility] Consultation      Name: Kathleen Gray MRN: [id number] DOB: March 02, 1976    ADMISSION DATE:  05/16/2015    CHIEF COMPLAINT:   Acute resp failure from suicide attempt, patient with seizures   HISTORY OF PRESENT ILLNESS   Patient with acute shock last night, RT femoral CVL removed and RT IJ CVL placed this AM RT femoral artery placed Patient with shock-bradycardia-multiple vasopressors, fio2 at 100% Still with seizure lilke activity-Neuro Consult note recommend transfer for continuous EEG monitoring   SIGNIFICANT EVENTS  10/7 intubated 10/9 RT IJ CVL 10/9 RT fem Art line   PAST MEDICAL HISTORY    :  Past Medical History  Diagnosis Date  . Hypothyroidism   . Depression   . Anxiety    Past Surgical History  Procedure Laterality Date  . Cosmetic surgery     Prior to Admission medications   Medication Sig Start Date End Date Taking? Authorizing Provider  buPROPion (WELLBUTRIN XL) 150 MG 24 hr tablet Take 3 tablets (450 mg total) by mouth daily. 03/14/15  Yes Kerin Salen, MD  levothyroxine (SYNTHROID, LEVOTHROID) 150 MCG tablet Take 150 mcg by mouth daily before breakfast.   Yes Historical Provider, MD  Norgestimate-Ethinyl Estradiol Triphasic (ORTHO TRI-CYCLEN LO) 0.18/0.215/0.25 MG-25 MCG tab Take 1 tablet by mouth daily.   Yes Historical Provider, MD  traZODone (DESYREL) 50 MG tablet Take 50-100 mg by mouth at bedtime as needed for sleep.   Yes Historical Provider, MD   No Known Allergies   FAMILY HISTORY   Family History  Problem Relation Age of Onset  . Depression Mother   . Drug abuse Mother   . Heart disease Father   . Alcohol abuse Father   . Depression Father   . Diabetes Father   . Thyroid disease Father   . Depression Sister   . Polycystic ovary syndrome Sister       SOCIAL HISTORY    reports that she has never smoked. She has never used smokeless tobacco. She reports that she drinks about 0.6 oz of alcohol per  week. She reports that she does not use illicit drugs.  Review of Systems  Unable to perform ROS: critical illness      VITAL SIGNS    Temp:  [99.3 F (37.4 C)-103.1 F (39.5 C)] 102.3 F (39.1 C) (10/09 0715) Pulse Rate:  [52-121] 52 (10/09 0800) Resp:  [14-39] 22 (10/09 0800) BP: (51-129)/(32-91) 72/34 mmHg (10/09 0800) SpO2:  [80 %-100 %] 87 % (10/09 0800) FiO2 (%):  [30 %-100 %] 100 % (10/09 0339) HEMODYNAMICS:   VENTILATOR SETTINGS: Vent Mode:  [-] PRVC FiO2 (%):  [30 %-100 %] 100 % Set Rate:  [20 bmp] 20 bmp Vt Set:  [450 mL] 450 mL PEEP:  [5 cmH20] 5 cmH20 Plateau Pressure:  [10 cmH20] 10 cmH20 INTAKE / OUTPUT:  Intake/Output Summary (Last 24 hours) at 05/08/15 0904 Last data filed at 05/08/15 0800  Gross per 24 hour  Intake 3351.03 ml  Output    975 ml  Net 2376.03 ml       PHYSICAL EXAM   Physical Exam  Constitutional: She appears distressed.  HENT:  Head: Normocephalic and atraumatic.  Eyes: Pupils are equal, round, and reactive to light. No scleral icterus.  Neck: Normal range of motion. Neck supple.  Cardiovascular: Normal rate and regular rhythm.   No murmur heard. Pulmonary/Chest: No respiratory distress. She has no wheezes. She has rales.  resp distress  Abdominal: Soft. She exhibits no distension. There is no tenderness.  Musculoskeletal: She exhibits no edema.  Neurological: She displays normal reflexes. Coordination normal.  Gcs<8T, seizure like activity  Skin: Skin is warm. No rash noted. She is diaphoretic.       LABS   LABS:  CBC  Recent Labs Lab 2015-05-09 1744 05/07/15 0619 05/08/15 0508  WBC 10.4 19.2* 19.5*  HGB 14.2 13.3 10.7*  HCT 43.3 40.7 32.9*  PLT 298 271 212   Coag's  Recent Labs Lab 05/08/15 0508  APTT 33  INR 1.22   BMET  Recent Labs Lab 05/09/15 1744 05/07/15 0619 05/08/15 0350  NA 143 143 145  K 2.9* 3.7 4.2  CL 111 112* 114*  CO2 19* 22 22  BUN CREATININE 0.86 0.77 0.91    GLUCOSE 114* 98 84   Electrolytes  Recent Labs Lab 05/05/15 2100 2015-05-09 1744 05/07/15 0619 05/08/15 0350 05/08/15 0508  CALCIUM  --  8.6* 8.5* 8.1*  --   MG 2.0  --   --   --  2.0   Sepsis Markers No results for input(s): LATICACIDVEN, PROCALCITON, O2SATVEN in the last 168 hours. ABG  Recent Labs Lab 05/09/15 2005 05/07/15 0440  PHART 7.39 7.40  PCO2ART 34 35  PO2ART 153* 114*   Liver Enzymes  Recent Labs Lab 09-May-2015 1744  AST 30  ALT 30  ALKPHOS 51  BILITOT 1.0  ALBUMIN 4.3   Cardiac Enzymes No results for input(s): TROPONINI, PROBNP in the last 168 hours. Glucose  Recent Labs Lab 05/08/15 0727  GLUCAP 95     Recent Results (from the past 240 hour(s))  MRSA PCR Screening     Status: None   Collection Time: 05-09-15 11:05 PM  Result Value Ref Range Status   MRSA by PCR NEGATIVE NEGATIVE Final    Comment:        The GeneXpert MRSA Assay (FDA approved for NASAL specimens only), is one component of a comprehensive MRSA colonization surveillance program. It is not intended to diagnose MRSA infection nor to guide or monitor treatment for MRSA infections.   Urine culture     Status: None (Preliminary result)   Collection Time: 05/07/15  8:32 AM  Result Value Ref Range Status   Specimen Description URINE, CATHETERIZED  Final   Special Requests Normal  Final   Culture NO GROWTH < 24 HOURS  Final   Report Status PENDING  Incomplete  Culture, blood (routine x 2)     Status: None (Preliminary result)   Collection Time: 05/07/15  8:43 AM  Result Value Ref Range Status   Specimen Description BLOOD RIGHT AC  Final   Special Requests BOTTLES DRAWN AEROBIC AND ANAEROBIC  7CC  Final   Culture NO GROWTH < 24 HOURS  Final   Report Status PENDING  Incomplete  Culture, blood (routine x 2)     Status: None (Preliminary result)   Collection Time: 05/07/15  8:55 AM  Result Value Ref Range Status   Specimen Description BLOOD RIGHT HAND  Final   Special  Requests   Final    BOTTLES DRAWN AEROBIC AND ANAEROBIC  AER 7CC ANA 10CC   Culture NO GROWTH < 24 HOURS  Final   Report Status PENDING  Incomplete     Current facility-administered medications:  .  acetaminophen (TYLENOL) suppository 650 mg, 650 mg, Rectal, Q4H PRN, Arnaldo Natal, MD, 650 mg at 05/08/15 0610 .  amantadine (SYMMETREL) solution 100  mg, 100 mg, Oral, BID, Mellody Drown, MD, 100 mg at 05/07/15 2349 .  antiseptic oral rinse solution (CORINZ), 7 mL, Mouth Rinse, QID, Altamese Dilling, MD, 7 mL at 05/08/15 0534 .  chlorhexidine gluconate (PERIDEX) 0.12 % solution 15 mL, 15 mL, Mouth Rinse, BID, Altamese Dilling, MD, 15 mL at 05/07/15 1925 .  DOPamine (INTROPIN) 800 mg in dextrose 5 % 250 mL (3.2 mg/mL) infusion, 0-20 mcg/kg/min, Intravenous, Titrated, Arnaldo Natal, MD, Last Rate: 27.9 mL/hr at 05/08/15 0500, 20 mcg/kg/min at 05/08/15 0500 .  enoxaparin (LOVENOX) injection 40 mg, 40 mg, Subcutaneous, Q24H, Altamese Dilling, MD, 40 mg at 05/07/15 2225 .  EPINEPHrine (ADRENALIN) 4 mg in dextrose 5 % 250 mL (0.016 mg/mL) infusion, 0.5-20 mcg/min, Intravenous, Titrated, Arnaldo Natal, MD, Last Rate: 150 mL/hr at 05/08/15 0757, 40 mcg/min at 05/08/15 0757 .  fentaNYL in NS (58mcg/ml) infusion-PREMIX, 10 mcg/hr, Intravenous, Continuous, Mellody Drown, MD, Stopped at 05/08/15 0400 .  Influenza vac split quadrivalent PF (FLUARIX) injection 0.5 mL, 0.5 mL, Intramuscular, Tomorrow-1000, Gale Journey, MD .  LORazepam (ATIVAN) injection 2 mg, 2 mg, Intravenous, Q1H PRN, Gale Journey, MD, 2 mg at 05/08/15 0120 .  midazolam (VERSED) 50 mg in sodium chloride 0.9 % 50 mL (1 mg/mL) infusion, 3 mg/hr, Intravenous, Continuous, Mellody Drown, MD, Stopped at 05/08/15 0400 .  pantoprazole (PROTONIX) injection 40 mg, 40 mg, Intravenous, Q24H, Zigmund Gottron, MD, 40 mg at 05/07/15 2350 .  sodium chloride 0.9 % injection 10-40 mL, 10-40 mL,  Intracatheter, PRN, Arnaldo Natal, MD .  vasopressin (PITRESSIN) 40 Units in sodium chloride 0.9 % 250 mL (0.16 Units/mL) infusion, 0.03 Units/min, Intravenous, Continuous, Arnaldo Natal, MD, Last Rate: 11.3 mL/hr at 05/08/15 0634, 0.03 Units/min at 05/08/15 0634  IMAGING    Ct Head Wo Contrast  05/07/2015   CLINICAL DATA:  Delayed recovery after sedation.  Seizure activity.  EXAM: CT HEAD WITHOUT CONTRAST  TECHNIQUE: Contiguous axial images were obtained from the base of the skull through the vertex without intravenous contrast.  COMPARISON:  None.  FINDINGS: The brain has a normal appearance without evidence of malformation, atrophy, old or acute infarction, mass lesion, hemorrhage, hydrocephalus or extra-axial collection. The calvarium is unremarkable. The paranasal sinuses, middle ears and mastoids are clear.  IMPRESSION: Normal head CT   Electronically Signed   By: Paulina Fusi M.D.   On: 05/07/2015 11:40   Dg Abd Portable 1v  05/07/2015   CLINICAL DATA:  Bedside orogastric tube placement.  EXAM: PORTABLE ABDOMEN - 1 VIEW  COMPARISON:  None.  FINDINGS: OG tube tip projects at the expected location of the gastric antrum. Bowel gas pattern unremarkable.  IMPRESSION: 1. OG tube tip projects at the expected location of the gastric antrum. 2. No acute abdominal abnormality.   Electronically Signed   By: Hulan Saas M.D.   On: 05/07/2015 16:26      Indwelling Urinary Catheter continued, requirement due to   Reason to continue Indwelling Urinary Catheter for strict Intake/Output monitoring for hemodynamic instability         Ventilator continued, requirement due to, resp failure    Ventilator Sedation RASS 0 to -2     INDWELLING DEVICES:: 10/7 ETT  10/9 RT IJ CVL 10/9 RT fem art line  MICRO DATA: MRSA PCR >>neg Urine  Blood>> Resp  UHCG >>neg ANTIMICROBIALS:  10/9 Vancomycin/zosyn>>    ASSESSMENT/PLAN  39 yo white female with acute resp failure from acute  encephalopathy from acute drug OD/suicide attempt with possible seizures with shock Possibly neurogenic,sepsis  PULMONARY -Respiratory Failure -continue Full MV support -continue Bronchodilator Therapy -Wean Fio2 and PEEP as tolerated -follow CXR and ABG pending   CARDIOVASCULAR-shock-neurogenic,sepsis, refractory shock from Welbutrin OD -check LA, vasopressors to keep MAP >65 -follow up cultures -empiric abx   RENAL Needs foley Follow chem 7  GASTROINTESTINAL -PPI for Gi prophylaxis  HEMATOLOGIC Follow h/h  INFECTIOUS empiric abx started  ENDOCRINE - ICU hypoglycemic\Hyperglycemia protocol   NEUROLOGIC - intubated and sedated - minimal sedation to achieve a RASS goal: -1 -status epilepticus-Transfer to [facility] for MRI head and Continuous EEG monitoring    I have personally obtained a history, examined the patient, evaluated laboratory and independently reviewed  imaging results, formulated the assessment and plan and placed orders.  The Patient requires high complexity decision making for assessment and support, frequent evaluation and titration of therapies, application of advanced monitoring technologies and extensive interpretation of multiple databases. Critical Care Time devoted to patient care services described in this note is 55 minutes.   Overall, patient is critically ill, prognosis is guarded. Patient at high risk for cardiac arrest and death.    Lucie Leather, M.D.  [facility]  Medical Director [facility] Medical Director [facility] Cardio-Pulmonary Department

## 2015-05-09 ENCOUNTER — Inpatient Hospital Stay: Payer: Medicaid Other

## 2015-05-09 DIAGNOSIS — G9340 Encephalopathy, unspecified: Secondary | ICD-10-CM

## 2015-05-09 DIAGNOSIS — J9601 Acute respiratory failure with hypoxia: Secondary | ICD-10-CM

## 2015-05-09 LAB — COMPREHENSIVE METABOLIC PANEL
ALT: 991 U/L — ABNORMAL HIGH (ref 14–54)
ANION GAP: 3 — AB (ref 5–15)
AST: 833 U/L — AB (ref 15–41)
Albumin: 2.6 g/dL — ABNORMAL LOW (ref 3.5–5.0)
Alkaline Phosphatase: 52 U/L (ref 38–126)
BUN: 16 mg/dL (ref 6–20)
CO2: 30 mmol/L (ref 22–32)
Calcium: 6.6 mg/dL — ABNORMAL LOW (ref 8.9–10.3)
Chloride: 110 mmol/L (ref 101–111)
Creatinine, Ser: 0.79 mg/dL (ref 0.44–1.00)
GFR calc Af Amer: >60 mL/min (ref >60)
GFR calc non Af Amer: >60 mL/min (ref >60)
GLUCOSE: 122 mg/dL — AB (ref 65–99)
POTASSIUM: 3.1 mmol/L — AB (ref 3.5–5.1)
Sodium: 143 mmol/L (ref 135–145)
Total Bilirubin: 1.2 mg/dL (ref 0.3–1.2)
Total Protein: 4.7 g/dL — ABNORMAL LOW (ref 6.5–8.1)

## 2015-05-09 LAB — BLOOD GAS, ARTERIAL
ACID-BASE EXCESS: 3 mmol/L (ref 0.0–3.0)
ACID-BASE EXCESS: 5.1 mmol/L — AB (ref 0.0–3.0)
ACID-BASE EXCESS: 4.2 mmol/L — AB (ref 0.0–3.0)
ALLENS TEST (PASS/FAIL): POSITIVE — AB
ALLENS TEST (PASS/FAIL): POSITIVE — AB
ALLENS TEST (PASS/FAIL): POSITIVE — AB
Acid-Base Excess: 15.9 mmol/L — ABNORMAL HIGH (ref 0.0–3.0)
Acid-Base Excess: 12.2 mmol/L — ABNORMAL HIGH (ref 0.0–3.0)
Acid-Base Excess: 5.1 mmol/L — ABNORMAL HIGH (ref 0.0–3.0)
Allens test (pass/fail): POSITIVE — AB
Allens test (pass/fail): POSITIVE — AB
BICARBONATE: 34.4 meq/L — AB (ref 21.0–28.0)
BICARBONATE: 30.5 meq/L — AB (ref 21.0–28.0)
BICARBONATE: 30.5 meq/L — AB (ref 21.0–28.0)
BICARBONATE: 30.3 meq/L — AB (ref 21.0–28.0)
Bicarbonate: 39.3 mEq/L — ABNORMAL HIGH (ref 21.0–28.0)
Bicarbonate: 24.2 mEq/L (ref 21.0–28.0)
FIO2: 1
FIO2: 1
FIO2: 0.6
FIO2: 0.45
FIO2: 100
FIO2: 100
LHR: 20 {breaths}/min
LHR: 12 {breaths}/min
MECHANICAL RATE: 20
MECHANICAL RATE: 12
O2 SAT: 94.1 %
O2 SAT: 99.2 %
O2 SAT: 91.8 %
O2 Saturation: 99.6 %
O2 Saturation: 95.3 %
O2 Saturation: 73 %
PATIENT TEMPERATURE: 37
PATIENT TEMPERATURE: 37
PATIENT TEMPERATURE: 37
PCO2 ART: 41 mmHg (ref 32.0–48.0)
PCO2 ART: 35 mmHg (ref 32.0–48.0)
PCO2 ART: 27 mmHg — AB (ref 32.0–48.0)
PEEP/CPAP: 10 cmH2O
PEEP: 10 cmH2O
PEEP: 10 cmH2O
PEEP: 10 cmH2O
PH ART: 7.59 — AB (ref 7.350–7.450)
PH ART: 7.6 — AB (ref 7.350–7.450)
PH ART: 7.39 (ref 7.350–7.450)
PO2 ART: 59 mmHg — AB (ref 83.0–108.0)
PO2 ART: 126 mmHg — AB (ref 83.0–108.0)
PO2 ART: 62 mmHg — AB (ref 83.0–108.0)
PO2 ART: 39 mmHg — AB (ref 83.0–108.0)
Patient temperature: 37
Patient temperature: 37
Patient temperature: 37
RATE: 26 resp/min
RATE: 26 resp/min
VT: 450 mL
VT: 450 mL
VT: 450 mL
VT: 450 mL
pCO2 arterial: 47 mmHg (ref 32.0–48.0)
pCO2 arterial: 47 mmHg (ref 32.0–48.0)
pCO2 arterial: 50 mmHg — ABNORMAL HIGH (ref 32.0–48.0)
pH, Arterial: 7.56 — ABNORMAL HIGH (ref 7.350–7.450)
pH, Arterial: 7.42 (ref 7.350–7.450)
pH, Arterial: 7.42 (ref 7.350–7.450)
pO2, Arterial: 156 mmHg — ABNORMAL HIGH (ref 83.0–108.0)
pO2, Arterial: 76 mmHg — ABNORMAL LOW (ref 83.0–108.0)

## 2015-05-09 LAB — URINE CULTURE
CULTURE: NO GROWTH
SPECIAL REQUESTS: NORMAL

## 2015-05-09 LAB — BASIC METABOLIC PANEL
ANION GAP: 3 — AB (ref 5–15)
BUN: 16 mg/dL (ref 6–20)
CALCIUM: 6.8 mg/dL — AB (ref 8.9–10.3)
CO2: 31 mmol/L (ref 22–32)
Chloride: 110 mmol/L (ref 101–111)
Creatinine, Ser: 0.78 mg/dL (ref 0.44–1.00)
GFR calc Af Amer: >60 mL/min (ref >60)
GLUCOSE: 121 mg/dL — AB (ref 65–99)
Potassium: 3.2 mmol/L — ABNORMAL LOW (ref 3.5–5.1)
Sodium: 144 mmol/L (ref 135–145)

## 2015-05-09 LAB — CBC
HEMATOCRIT: 30.3 % — AB (ref 35.0–47.0)
HEMOGLOBIN: 10 g/dL — AB (ref 12.0–16.0)
MCH: 29.1 pg (ref 26.0–34.0)
MCHC: 33 g/dL (ref 32.0–36.0)
MCV: 88.2 fL (ref 80.0–100.0)
Platelets: 139 10*3/uL — ABNORMAL LOW (ref 150–440)
RBC: 3.43 MIL/uL — ABNORMAL LOW (ref 3.80–5.20)
RDW: 13.6 % (ref 11.5–14.5)
WBC: 12.7 10*3/uL — ABNORMAL HIGH (ref 3.6–11.0)

## 2015-05-09 LAB — CK
Total CK: 4378 U/L — ABNORMAL HIGH (ref 38–234)
Total CK: 11685 U/L — ABNORMAL HIGH (ref 38–234)
Total CK: 15266 U/L — ABNORMAL HIGH (ref 38–234)

## 2015-05-09 LAB — LACTIC ACID, PLASMA: Lactic Acid, Venous: 1.5 mmol/L (ref 0.5–2.0)

## 2015-05-09 LAB — GLUCOSE, CAPILLARY: GLUCOSE-CAPILLARY: 84 mg/dL (ref 65–99)

## 2015-05-09 LAB — TRIGLYCERIDES: Triglycerides: 123 mg/dL (ref <150)

## 2015-05-09 LAB — AMMONIA: AMMONIA: 34 umol/L (ref 9–35)

## 2015-05-09 MED ORDER — VECURONIUM BROMIDE 10 MG IV SOLR
10.0000 mg | Freq: Once | INTRAVENOUS | Status: AC
  Administered 2015-05-09: 10 mg via INTRAVENOUS

## 2015-05-09 MED ORDER — ETOMIDATE 2 MG/ML IV SOLN
10.0000 mg | Freq: Once | INTRAVENOUS | Status: AC
  Administered 2015-05-09: 10 mg via INTRAVENOUS

## 2015-05-09 MED ORDER — PROPOFOL 1000 MG/100ML IV EMUL
5.0000 ug/kg/min | INTRAVENOUS | Status: DC
  Administered 2015-05-09: 5 ug/kg/min via INTRAVENOUS
  Administered 2015-05-10 (×2): 25 ug/kg/min via INTRAVENOUS
  Administered 2015-05-10: 20 ug/kg/min via INTRAVENOUS
  Administered 2015-05-11: 25 ug/kg/min via INTRAVENOUS
  Administered 2015-05-11: 5 ug/kg/min via INTRAVENOUS
  Administered 2015-05-12: 20 ug/kg/min via INTRAVENOUS
  Administered 2015-05-12: 30 ug/kg/min via INTRAVENOUS
  Filled 2015-05-09 (×7): qty 100

## 2015-05-09 MED ORDER — ENOXAPARIN SODIUM 40 MG/0.4ML ~~LOC~~ SOLN
40.0000 mg | SUBCUTANEOUS | Status: DC
  Administered 2015-05-09 – 2015-05-16 (×8): 40 mg via SUBCUTANEOUS
  Filled 2015-05-09 (×8): qty 0.4

## 2015-05-09 MED ORDER — PROPOFOL 1000 MG/100ML IV EMUL
INTRAVENOUS | Status: AC
  Administered 2015-05-09: 5 ug/kg/min via INTRAVENOUS
  Filled 2015-05-09: qty 100

## 2015-05-09 MED ORDER — VANCOMYCIN HCL IN DEXTROSE 750-5 MG/150ML-% IV SOLN
750.0000 mg | Freq: Two times a day (BID) | INTRAVENOUS | Status: DC
  Filled 2015-05-09 (×2): qty 150

## 2015-05-09 MED ORDER — FENTANYL CITRATE (PF) 100 MCG/2ML IJ SOLN
12.5000 ug | INTRAMUSCULAR | Status: DC | PRN
  Administered 2015-05-09: 25 ug via INTRAVENOUS
  Filled 2015-05-09: qty 2

## 2015-05-09 MED ORDER — DEXTROSE 5 % IV SOLN
INTRAVENOUS | Status: DC
  Administered 2015-05-09 – 2015-05-10 (×2): via INTRAVENOUS
  Filled 2015-05-09 (×3): qty 1000

## 2015-05-09 MED ORDER — PROPOFOL 1000 MG/100ML IV EMUL: 5.0000 ug/kg/min | INTRAVENOUS | Status: DC

## 2015-05-09 MED ORDER — LORAZEPAM 2 MG/ML IJ SOLN: 1.0000 mg | INTRAMUSCULAR | Status: DC

## 2015-05-09 MED ORDER — VECURONIUM BROMIDE 10 MG IV SOLR
INTRAVENOUS | Status: AC
  Filled 2015-05-09: qty 10

## 2015-05-09 MED ORDER — LORAZEPAM 2 MG/ML IJ SOLN: 0.5000 mg | INTRAMUSCULAR | Status: DC | PRN

## 2015-05-09 MED ORDER — LACTULOSE 10 GM/15ML PO SOLN
30.0000 g | Freq: Two times a day (BID) | ORAL | Status: DC
  Administered 2015-05-09 – 2015-05-13 (×9): 30 g
  Filled 2015-05-09 (×9): qty 60

## 2015-05-09 MED ORDER — FENTANYL CITRATE (PF) 100 MCG/2ML IJ SOLN
25.0000 ug | INTRAMUSCULAR | Status: DC | PRN
  Administered 2015-05-10: 100 ug via INTRAVENOUS
  Administered 2015-05-10: 50 ug via INTRAVENOUS
  Administered 2015-05-10: 100 ug via INTRAVENOUS
  Administered 2015-05-11: 30 ug via INTRAVENOUS
  Administered 2015-05-11: 50 ug via INTRAVENOUS
  Administered 2015-05-11 (×2): 100 ug via INTRAVENOUS
  Administered 2015-05-11: 50 ug via INTRAVENOUS
  Filled 2015-05-09 (×8): qty 2

## 2015-05-09 MED ORDER — PANTOPRAZOLE SODIUM 40 MG IV SOLR
40.0000 mg | Freq: Every day | INTRAVENOUS | Status: DC
  Administered 2015-05-09 – 2015-05-12 (×4): 40 mg via INTRAVENOUS
  Filled 2015-05-09 (×5): qty 40

## 2015-05-09 NOTE — Progress Notes (Signed)
eLink Physician-Brief Progress Note Patient Name: Kathleen Gray DOB: 1975/08/26 MRN: [id number]   Date of Service  05/09/2015  HPI/Events of Note  Last ABG done yesterday at 1 PM >> pH = 7.6.  eICU Interventions  Will order ABG now.      Intervention Category Major Interventions: Acid-Base disturbance - evaluation and management;Respiratory failure - evaluation and management  Makale Pindell Eugene 05/09/2015, 5:10 AM

## 2015-05-09 NOTE — Progress Notes (Signed)
Pt profile: 71 F with multiple previous suicide attempts took large OD of bupropion 10/07 c/b acute resp failure, protracted seizures, shock. Improved 10/10 and able to F/C intermittently. Failed extubation 10/10 with pulm edema pattern on CXR after re-intubation  Active problems: VDRF Pulm edema Failed extubation 10/10 Cardiomyopathy (LVEF 40%) Shock, resolved Hypokalemia Acute encephalopathy Seizures due to bupropion OD   Lines, Tubes, etc: ETT 10/07 >> 10/10, 10/10 >>  R IJ CVL 10/09 >>  R femoral A-line 10/09 >>   Microbiology: MRSA PCR 10/07 >> NEG Urine 10/08 >> NEG Resp 10/10 >>  Blood 10/08 >>  PCT 10/11 >>   Antibiotics:  Vanc 10/09 >> 10/10 Pip-tazo 10/09 >> 10/10    Studies/Events: 10/08 CT head: normal 10/09 TTE: LVEF 40-45% 10/10 Failed extubation. 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  Consults:  PCCM 10/08 Neuro 10/08   Best Practice: DVT: LMWH SUP: PPI Nutrition: on hold Glycemic control: no SSI indicated Sedation/analgesia: Propofol  Subj: Events as above  Obj: Filed Vitals:   05/09/15 1200  BP: 87/53  Pulse: 100  Temp: 98.7 F (37.1 C)  Resp: 25    Gen: WDWN, RASS -2, initially F/C this AM. Not F/C later in day HEENT: NCAT, EOMI, PERRL Neck: No JVD Chest: diffuse rhonchi Cardiac: reg, no M Abd: soft, NT Ext: warm, no edema Neuro: MAEs vigorously to stimulation  BMET BMP Latest Ref Rng 05/09/2015 05/09/2015 05/08/2015  Glucose 65 - 99 mg/dL 454(U) 981(X) 914(N)  BUN 6 - 20 mg/dL Creatinine 0.44 - 1.00 mg/dL 8.29 5.62 1.30(Q)  Sodium 135 - 145 mmol/L 143 144 146(H)  Potassium 3.5 - 5.1 mmol/L 3.1(L) 3.2(L) 2.6(LL)  Chloride 101 - 111 mmol/L 110 110 105  CO2 22 - 32 mmol/L 30 31 32  Calcium 8.9 - 10.3 mg/dL 6.6(L) 6.8(L) 6.7(L)     CBC CBC Latest Ref Rng 05/09/2015 05/08/2015 05/08/2015  WBC 3.6 - 11.0 K/uL  12.7(H) 23.7(H) 19.5(H)  Hemoglobin 12.0 - 16.0 g/dL 10.0(L) 12.0 10.7(L)  Hematocrit 35.0 - 47.0 % 30.3(L) 36.2 32.9(L)  Platelets 150 - 440 K/uL 139(L) 258 212      CXR: edema pattern   IMPRESSION: VDRF Failed extubation 10/10 pulm edema Cardiomyopathy - likely toxic Hypokalemia Rhabdomyolysis Lactic acidosis, resolved Elevated LFTs Severe TME, Likely toxic, possible hepatic in origin  PLAN/RECS:  Vent settings established Vent bundle implemented Daily SBT as indicated MAP goal > 60 mmHg Monitor BMET intermittently Monitor I/Os Correct electrolytes as indicated Cont SUP and DVT px Consider TFs 10/11 Cont anticonvulsants per Neuro Consider repeat CT head 10/11  CCM time: 60 mins The above time includes time spent in consultation with patient and/or family members and reviewing care plan on multidisciplinary rounds  Billy Fischer, MD PCCM service Mobile [phone] Pager [phone]

## 2015-05-09 NOTE — Progress Notes (Signed)
NEUROLOGY NOTE  S: Pt extubated today, still not following.  No seizure activity noted  ROS unobtainable to mental status  O: 98.7    87/53     100    25 Moderate distress, nl weight Normocephalic, oropharynx clear Supple, no JVD Coarse BS with wheezing B tachycardic, no murmur no C/C/E  Lethargic, does not open or follow, mute, GCS 7T Pupils 6mm and nonreactive, good corneals B, weak cough Localizes B, nl tone  A/P: 1. Status epilepticus-  Appears resolved now as no seizures on EEG 2.  Encephalopathy-  Severe on EEG and likely related to overdose;  Concern for rising LFTs as cause 3.  Elevated CPK-  Likely due to seizure -  Follow daily CPK -  Start lactulose 30gm BID -  Continue Keppra 1gm BID -  Continue ativan  q4h with PRN doses as well -  Check ammonia as well -  Will continue to follow

## 2015-05-09 NOTE — Care Management Note (Signed)
Case Management Note  Patient Details  Name: Kathleen Gray MRN: [id number] Date of Birth: 11/12/1975  Subjective/Objective:  Extubated this am and maintaining her airway currently. [facility] transfer cancelled at this point due to improvement.                  Action/Plan:   Expected Discharge Date:                  Expected Discharge Plan:     In-House Referral:  Clinical Social Work  Discharge planning Services  CM Consult  Post Acute Care Choice:    Choice offered to:     DME Arranged:    DME Agency:     HH Arranged:    HH Agency:     Status of Service:  In process, will continue to follow  Medicare Important Message Given:    Date Medicare IM Given:    Medicare IM give by:    Date Additional Medicare IM Given:    Additional Medicare Important Message give by:     If discussed at Long Length of Stay Meetings, dates discussed:    Additional Comments:  [name], RN 05/09/2015, 10:53 AM

## 2015-05-09 NOTE — Procedures (Signed)
Oral Intubation Procedure Note   Procedure: Intubation Indications: Respiratory insufficiency, failed extubation Consent: Unable to obtain consent because of altered level of consciousness. Time Out: Verified patient identification, verified procedure, site/side was marked, verified correct patient position, special equipment/implants available, medications/allergies/relevent history reviewed, required imaging and test results available.   Pre-meds: Etomidate 20 mg IV  Neuromuscular blockade: Vecuronium 10 mg IV  Laryngoscope: #3 MAC  Visualization: cords fully visualized  ETT: 7.5 ETT passed on first attempt and secured @ 24 cm at upper incisors  Findings: Normal upper airway anatomy   Evaluation:  CXR pending + colorimetry change Pt tolerated procedure well without complications   Billy Fischer, MD PCCM service Mobile [phone] Pager [phone]

## 2015-05-09 NOTE — Progress Notes (Signed)
Pt extubated by RT with RN at bedside per Dr. Sung Amabile order. Pt tolerated well. Vss. Hr 98, 02 94% on 4l [place], rr22, bp 94/58 (69). Pt calm and resting at this time. Family at bedside. Will continue to monitor. Charge RN notified of extubation and safety sitter needed per order.

## 2015-05-09 NOTE — Progress Notes (Signed)
[facility] [place] NAME: Starlit Raburn    MR#:  [id number]  DATE OF BIRTH:  Aug 21, 1975  SUBJECTIVE:  CHIEF COMPLAINT:   Chief Complaint  Patient presents with  . Suicidal  . Drug Overdose   Seems to have stabilized overnight. No further seizure activity. Extubated this morning, now on face mask.  REVIEW OF SYSTEMS:   ROS unable to obtain due to AMS  DRUG ALLERGIES:  No Known Allergies  VITALS:  Blood pressure 87/53, pulse 100, temperature 98.7 F (37.1 C), temperature source Axillary, resp. rate 25, height 5' 5" (1.651 m), weight 74.526 kg (164 lb 4.8 oz), SpO2 88 %.  PHYSICAL EXAMINATION:  GENERAL:  39 y.o.-year-old patient lying in the bed, lethargic, face mask  EYES: Pupils equal, round, not reactive to light and accommodation. No scleral icterus. Extraocular muscles intact.  HEENT: Head atraumatic, normocephalic.  LUNGS: coarse breath sounds, fair air movement, no distress CARDIOVASCULAR: S1, S2 normal. No murmurs, rubs, or gallops.  ABDOMEN: Soft, nontender, nondistended. Bowel sounds present. No organomegaly or mass.  EXTREMITIES: No pedal edema, cyanosis, or clubbing.  NEUROLOGIC: sedated does not open eyes to voice PSYCHIATRIC: sedated  SKIN: No obvious rash, lesion, or ulcer.   LABORATORY PANEL:   CBC  Recent Labs Lab 05/09/15 0917  WBC 12.7*  HGB 10.0*  HCT 30.3*  PLT 139*   ------------------------------------------------------------------------------------------------------------------  Chemistries   Recent Labs Lab 05/08/15 0508  05/09/15 0917  NA  --   < > 143  144  K  --   < > 3.1*  3.2*  CL  --   < > 110  110  CO2  --   < > 30  31  GLUCOSE  --   < > 122*  121*  BUN  --   < > 16  16  CREATININE  --   < > 0.79  0.78  CALCIUM  --   < > 6.6*  6.8*  MG 2.0  --   --   AST  --   < > 833*  ALT  --   < > 991*  ALKPHOS  --   < > 52  BILITOT  --   < > 1.2  < > = values in this  interval not displayed. ------------------------------------------------------------------------------------------------------------------  Cardiac Enzymes No results for input(s): TROPONINI in the last 168 hours. ------------------------------------------------------------------------------------------------------------------  RADIOLOGY:  Dg Chest Port 1 View  05/08/2015   CLINICAL DATA:  Rule out pneumothorax, status post resuscitation.  EXAM: PORTABLE CHEST 1 VIEW  COMPARISON:  05/08/2015  FINDINGS: Right-sided internal jugular approach central venous catheter, endotracheal tube, and shock pads are in stable position.  Cardiomediastinal silhouette is normal. Mediastinal contours appear intact.  There is no evidence of pneumothorax. There are diffusely increased interstitial markings, with possible development of airspace consolidation in the right lower lobe. There is probable left lower lobe atelectasis.  Osseous structures are without acute abnormality. Soft tissues are grossly normal.  IMPRESSION: Stable positioning of supporting lines and tubes.  Interval development of increased interstitial markings, suggestive of developing pulmonary edema.  Possible development of right lower lobe airspace consolidation, and left lower lobe atelectasis.   Electronically Signed   By: Fidela Salisbury M.D.   On: 05/08/2015 10:36   Dg Chest Port 1 View  05/08/2015   CLINICAL DATA:  Respiratory failure and status post central line placement.  EXAM: PORTABLE CHEST 1 VIEW  COMPARISON:  05/07/2015  FINDINGS:  Interval placement of right jugular central line with the catheter tip at the cavoatrial junction. Endotracheal tube remains with the tip approximately 1.5 cm above the carina. No pneumothorax identified. Lungs show increased bilateral lower lobe atelectasis, left greater than right. There may be a developing infiltrate in the left lower lung. No overt edema or pleural fluid identified. The heart size is  normal.  IMPRESSION: The rib right jugular central line tip lies at the cavoatrial junction. No pneumothorax is seen after line placement. There is increase in bilateral lower lobe atelectasis/ consolidation, left greater than right. Developing infiltrate in the left lower lung cannot be excluded.   Electronically Signed   By: Aletta Edouard M.D.   On: 05/08/2015 09:17   Dg Abd Portable 1v  05/07/2015   CLINICAL DATA:  Bedside orogastric tube placement.  EXAM: PORTABLE ABDOMEN - 1 VIEW  COMPARISON:  None.  FINDINGS: OG tube tip projects at the expected location of the gastric antrum. Bowel gas pattern unremarkable.  IMPRESSION: 1. OG tube tip projects at the expected location of the gastric antrum. 2. No acute abdominal abnormality.   Electronically Signed   By: Evangeline Dakin M.D.   On: 05/07/2015 16:26    EKG:   Orders placed or performed during the hospital encounter of 05/30/2015  . ED EKG  . ED EKG    ASSESSMENT AND PLAN:   1) status epilepticus after bupropion overdose - resolved, no seizure on EEG today - encephalopathy on EEG concerning - neurology following - continue keppra  2) Vtach cardiac arrest -  due to lactic acidosis due to #1 -  Acidosis corrected  - ECHO with EF 40-45%  3) leucocytosis with fever -  likely due to seizure, now improving -  Blood and urine cultures pending - CXR with possible consolidation, started vanc/zosy for HCAP on 10/9  4) anemia - hgb drop 13--10 , minimal bleeding from mouth noted. Monitor closely.  5) rhabdomyolysis after seizure - CK 4000 >> 11000 - continue hydration, monitor renal function  6) acute respiratory failure - intubated during seizure, now encephalopathic, not sure she will maintain airway today  7) transaminitis - improving, possibly due to overdose, hypotension, seizure?   CODE STATUS: full  TOTAL TIME TAKING CARE OF THIS PATIENT: 60 minutes.  Greater than 50% of time spent in care coordination and counseling.  Met with family multiple times for updates and care planning.  POSSIBLE D/C IN ? DAYS, DEPENDING ON CLINICAL CONDITION.   Myrtis Ser M.D on 05/09/2015 at 3:56 PM  Between 7am to 6pm - Pager - [phone]  After 6pm go to [email or web address] - password [email or web address] [facility]  [facility]  Office  [phone]  CC: Primary care physician; No PCP Per Patient

## 2015-05-09 NOTE — Progress Notes (Signed)
Pt 02 sat dropped to 87, pt lethargic, can hear secretions in thoat, coached pt to cough with no success. Pulse ox changed to finger, 02 sat 83, RT suctioned pt via nts.02 increased to 85. Nonrebreather placed on pt and stat ABG drawn per order.

## 2015-05-09 NOTE — Progress Notes (Signed)
Patient awakens more alert with mouthcare, movements appear more purposeful appears to be reaching for tube.  Looks towards RN when name is called.  When asked several times patient appears to squeeze RNs hand with R hand.  Attempts made to comfort and reorient patient, ativan  given IV will continue to assess for changes/need.

## 2015-05-09 NOTE — Progress Notes (Signed)
ANTIBIOTIC CONSULT NOTE - FOLLOW UP  Pharmacy Consult for Vacnomycin/Zosyn  Indication: HCAP  No Known Allergies  Patient Measurements: Height:  (165.1 cm) Weight: 164 lb 4.8 oz (74.526 kg) IBW/kg (Calculated) : 57 Adjusted Body Weight: 64 kg  Vital Signs: Temp: 98.7 F (37.1 C) (10/10 0800) Temp Source: Axillary (10/10 0800) BP: 83/37 mmHg (10/10 0900) Pulse Rate: 89 (10/10 0900) Intake/Output from previous day: 10/09 0701 - 10/10 0700 In: 3834.3 [I.V.:933.3; IV Piggyback:2450] Out: 2100 [Urine:1850; Emesis/NG output:250] Intake/Output from this shift: Total I/O In: 6 [I.V.:6] Out: 300 [Urine:300]  Labs:  Recent Labs  05/07/15 0619 05/08/15 0350 05/08/15 0508 05/08/15 1602  WBC 19.2*  --  19.5* 23.7*  HGB 13.3  --  10.7* 12.0  PLT 271  --  212 258  CREATININE 0.77 0.91  --  1.35*   Estimated Creatinine Clearance: 57.6 mL/min (by C-G formula based on Cr of 1.35). No results for input(s): VANCOTROUGH, VANCOPEAK, VANCORANDOM, GENTTROUGH, GENTPEAK, GENTRANDOM, TOBRATROUGH, TOBRAPEAK, TOBRARND, AMIKACINPEAK, AMIKACINTROU, AMIKACIN in the last 72 hours.   Microbiology: Recent Results (from the past 720 hour(s))  MRSA PCR Screening     Status: None   Collection Time: 05-21-2015 11:05 PM  Result Value Ref Range Status   MRSA by PCR NEGATIVE NEGATIVE Final    Comment:        The GeneXpert MRSA Assay (FDA approved for NASAL specimens only), is one component of a comprehensive MRSA colonization surveillance program. It is not intended to diagnose MRSA infection nor to guide or monitor treatment for MRSA infections.   Urine culture     Status: None (Preliminary result)   Collection Time: 05/07/15  8:32 AM  Result Value Ref Range Status   Specimen Description URINE, CATHETERIZED  Final   Special Requests Normal  Final   Culture NO GROWTH < 24 HOURS  Final   Report Status PENDING  Incomplete  Culture, blood (routine x 2)     Status: None (Preliminary result)    Collection Time: 05/07/15  8:43 AM  Result Value Ref Range Status   Specimen Description BLOOD RIGHT AC  Final   Special Requests BOTTLES DRAWN AEROBIC AND ANAEROBIC  7CC  Final   Culture NO GROWTH 2 DAYS  Final   Report Status PENDING  Incomplete  Culture, blood (routine x 2)     Status: None (Preliminary result)   Collection Time: 05/07/15  8:55 AM  Result Value Ref Range Status   Specimen Description BLOOD RIGHT HAND  Final   Special Requests   Final    BOTTLES DRAWN AEROBIC AND ANAEROBIC  AER 7CC ANA 10CC   Culture NO GROWTH 2 DAYS  Final   Report Status PENDING  Incomplete    Anti-infectives    Start     Dose/Rate Route Frequency Ordered Stop   05/09/15 1300  vancomycin (VANCOCIN) IVPB 750 mg/150 ml premix     750 mg 150 mL/hr over 60 Minutes Intravenous Every 12 hours 05/09/15 0918     05/09/15 0130  vancomycin (VANCOCIN) IVPB 750 mg/150 ml premix  Status:  Discontinued     750 mg 150 mL/hr over 60 Minutes Intravenous Every 8 hours 05/08/15 1644 05/09/15 0918   05/08/15 2345  piperacillin-tazobactam (ZOSYN) IVPB 3.375 g     3.375 g 12.5 mL/hr over 240 Minutes Intravenous 3 times per day 05/08/15 2335     05/08/15 1645  vancomycin (VANCOCIN) 1,750 mg in sodium chloride 0.9 % 500 mL IVPB  1,750 mg 250 mL/hr over 120 Minutes Intravenous  Once 05/08/15 1644 05/08/15 1933      Assessment: 39yo female hospitalized for buproprion overdose. Patient is currently intubated and has had multiple seizures. CXR from 10/9 show RLL consolidation and LLL atelectasis, HCAP is suspected. Pharamcy has been consulted for Vanc and Zosyn dosing and monitoring.  She is currently Afebrile, but has a Scr increase from 1.35 from 0.91 and a decrease in her CrCl from 85.5 ml/min to 57.6/min. Patient is currently receiving Vancomycin  q8 hours (X1 dose givem with a  bolus dose) and Zosyn 3.375 q8 hours.   Goal of Therapy:  Vancomycin trough level 15-20 mcg/ml  Plan:  New Ke: 0.047      T1/2: 14.8    Vd:52 Due to change in patients renal function, will extend vancomycin dosing interval from q8h to q12h. Trough level will be draw prior to 4th dose,  on 10/11. Will continue patient on Zosyn 3.375 q8 hr.  Pharmacy will continue to monitor patients labs and make adjustments as needed.   Cher Nakai, PharmD Pharmacy Resident

## 2015-05-09 NOTE — Care Management Note (Signed)
Case Management Note  Patient Details  Name: Kathleen Gray MRN: [id number] Date of Birth: 1975-11-22  Subjective/Objective:   Admitted following an intentional overdose of approximately 90  Wellbutrin. 10/9 S/P Code Blue "TACH ARREST/PEA" for approx 5 minutes. Neurology has consult and recommended continuous EEG monitoring. Intubated with multiple vasopressors. Intermittent seizure like activity.               Action/Plan:  Plan is transfer to [facility] when medically stable for transfer                Expected Discharge Date:                  Expected Discharge Plan:     In-House Referral:  Clinical Social Work  Discharge planning Services  CM Consult  Post Acute Care Choice:    Choice offered to:     DME Arranged:    DME Agency:     HH Arranged:    HH Agency:     Status of Service:  In process, will continue to follow  Medicare Important Message Given:    Date Medicare IM Given:    Medicare IM give by:    Date Additional Medicare IM Given:    Additional Medicare Important Message give by:     If discussed at Long Length of Stay Meetings, dates discussed:    Additional Comments:  [name], RN 05/09/2015, 9:33 AM

## 2015-05-09 NOTE — Progress Notes (Signed)
1700 Pt 02 sat dropped to 70-80%, could hear secretions in throat, pt suction with nasotracheal suction. 02 sat continued to decline, pt became very restless and agitated. Stat ABG ordered. Dr. Sung Amabile called, pt was sedated and emergently intubated at 1730 with RT, RN, NT and Charge RN at beside. Pt tolerated well with no complications. Propofol gtt initiated per order. Pt VSS. No other significant events for remainder of shift. RN to continue to monitor.

## 2015-05-09 NOTE — Progress Notes (Signed)
Patient remains intubated on the vent, versed gtt continues.  Patient with increased responsiveness, Dr. Katrinka Blazing (Neuro) at bedside briefly updated on patient condition.  Opens eyes to name, attempts to follow simple commands of squeezing hand and raising head.  Levophed titrated off, patient maintaining pressure WDL. See CHL for further update, will continue to assess for changes/need.

## 2015-05-09 NOTE — Progress Notes (Signed)
Nutrition Follow-up    INTERVENTION:   Meals/Snacks: await diet progression post extubation   NUTRITION DIAGNOSIS:   Inadequate oral intake related to acute illness as evidenced by NPO status.  GOAL:   Provide needs based on ASPEN/SCCM guidelines  MONITOR:    (Energy Intake, Anthropometrics, Electrolyte/Renal Profile, Glucose Profile, Digestive System)   ASSESSMENT:    Pt s/p code blue yesterday morning, seizures; pt extubated this AM  Diet Order:  Diet NPO time specified day 3  Electrolyte and Renal Profile:  Recent Labs Lab 05/05/15 2100  05/08/15 0350 05/08/15 0508 05/08/15 1602 05/09/15 0917  BUN  --   < > 11  --  CREATININE  --   < > 0.91  --  1.35* 0.79  0.78  NA  --   < > 145  --  146* 143  144  K  --   < > 4.2  --  2.6* 3.1*  3.2*  MG 2.0  --   --  2.0  --   --   < > = values in this interval not displayed. Glucose Profile:   Recent Labs  05/08/15 0727 05/08/15 1131 05/08/15 1541  GLUCAP 95 94 118*   Meds: D5 with Kcl at 50 ml/hr  Height:   Ht Readings from Last 1 Encounters:  05/23/2015  (1.651 m)    Weight:   Wt Readings from Last 1 Encounters:  05/27/2015 164 lb 4.8 oz (74.526 kg)    BMI:  Body mass index is 27.34 kg/(m^2).  Estimated Nutritional Needs:   Kcal:  1610-9604 kcals (BEE 1430, 1.2 AF, 1.0-1.2 IF)   Protein:  74-90 g (1.0-1.2 g/kg)   Fluid:  1875-2250 mL (25-30 ml/kg)   HIGH Care Level  Romelle Starcher MS, RD, LDN [phone] Pager

## 2015-05-09 NOTE — Progress Notes (Signed)
eLink Physician-Brief Progress Note Patient Name: Kathleen Gray DOB: 18-Nov-1975 MRN: [id number]   Date of Service  05/09/2015  HPI/Events of Note  ABG on 60%/PRVC 20/TV 450/P 10 = 7.56/27/126/24.2.  eICU Interventions  Will order: 1. Decrease rate to 13. 2. Check ABG at 7:30 AM.     Intervention Category Major Interventions: Respiratory failure - evaluation and management  Kathleen Gray 05/09/2015, 6:30 AM

## 2015-05-09 NOTE — Progress Notes (Signed)
Recd call from [place] [facility]. Provided up date/requested info on pt status. RN was instructed to call CDS if there was neuro decline or pt was re-entubated.

## 2015-05-09 NOTE — Progress Notes (Signed)
Pt remains in vent with versed at 75mcg/h and vasopressin at 0.03unit/min. Pt will open eyes to speech, squeeze hands and move toes and make spastic nonpurposeful movements at times. Pt daughter and son at bedside. Discussed setting up a password with family. Will continue to monitor.

## 2015-05-09 NOTE — Progress Notes (Signed)
Pt vss. 02 sat 95%. Will continue to monitor.  ABG: Results for Kathleen Gray, Kathleen Gray (MRN [id number]) as of 05/09/2015 13:27  Ref. Range 05/09/2015 13:12  pH, Arterial Latest Ref Range: 7.350-7.450  7.42  pCO2 arterial Latest Ref Range: 32.0-48.0 mmHg 47  pO2, Arterial Latest Ref Range: 83.0-108.0 mmHg 62 (L)  Bicarbonate Latest Ref Range: 21.0-28.0 mEq/L 30.5 (H)  Acid-Base Excess Latest Ref Range: 0.0-3.0 mmol/L 5.1 (H)  O2 Saturation Latest Units: % 91.8  Patient temperature Unknown 37.0  Collection site Unknown A-LINE  Allens test (pass/fail) Latest Ref Range: PASS  POSITIVE (A)

## 2015-05-10 ENCOUNTER — Inpatient Hospital Stay: Payer: Medicaid Other

## 2015-05-10 DIAGNOSIS — J810 Acute pulmonary edema: Secondary | ICD-10-CM

## 2015-05-10 LAB — COMPREHENSIVE METABOLIC PANEL
ALBUMIN: 2.7 g/dL — AB (ref 3.5–5.0)
ALBUMIN: 2.6 g/dL — AB (ref 3.5–5.0)
ALK PHOS: 52 U/L (ref 38–126)
ALK PHOS: 63 U/L (ref 38–126)
ALT: 999 U/L — AB (ref 14–54)
ALT: 1099 U/L — ABNORMAL HIGH (ref 14–54)
ANION GAP: 1 — AB (ref 5–15)
AST: 699 U/L — AB (ref 15–41)
AST: 764 U/L — AB (ref 15–41)
Anion gap: 4 — ABNORMAL LOW (ref 5–15)
BILIRUBIN TOTAL: 1 mg/dL (ref 0.3–1.2)
BUN: 14 mg/dL (ref 6–20)
BUN: 15 mg/dL (ref 6–20)
CALCIUM: 7.2 mg/dL — AB (ref 8.9–10.3)
CHLORIDE: 110 mmol/L (ref 101–111)
CO2: 28 mmol/L (ref 22–32)
CO2: 29 mmol/L (ref 22–32)
CREATININE: 0.69 mg/dL (ref 0.44–1.00)
Calcium: 7.2 mg/dL — ABNORMAL LOW (ref 8.9–10.3)
Chloride: 111 mmol/L (ref 101–111)
Creatinine, Ser: 0.65 mg/dL (ref 0.44–1.00)
GFR calc Af Amer: >60 mL/min (ref >60)
GFR calc non Af Amer: >60 mL/min (ref >60)
GFR calc non Af Amer: >60 mL/min (ref >60)
GLUCOSE: 73 mg/dL (ref 65–99)
GLUCOSE: 73 mg/dL (ref 65–99)
POTASSIUM: 3.4 mmol/L — AB (ref 3.5–5.1)
Potassium: 3.4 mmol/L — ABNORMAL LOW (ref 3.5–5.1)
SODIUM: 142 mmol/L (ref 135–145)
SODIUM: 141 mmol/L (ref 135–145)
Total Bilirubin: 0.5 mg/dL (ref 0.3–1.2)
Total Protein: 4.8 g/dL — ABNORMAL LOW (ref 6.5–8.1)
Total Protein: 5.2 g/dL — ABNORMAL LOW (ref 6.5–8.1)

## 2015-05-10 LAB — LACTATE DEHYDROGENASE: LDH: 945 U/L — ABNORMAL HIGH (ref 98–192)

## 2015-05-10 LAB — GLUCOSE, CAPILLARY
GLUCOSE-CAPILLARY: 71 mg/dL (ref 65–99)
GLUCOSE-CAPILLARY: 81 mg/dL (ref 65–99)
GLUCOSE-CAPILLARY: 82 mg/dL (ref 65–99)
GLUCOSE-CAPILLARY: 86 mg/dL (ref 65–99)
Glucose-Capillary: 73 mg/dL (ref 65–99)

## 2015-05-10 LAB — CBC
HCT: 26.8 % — ABNORMAL LOW (ref 35.0–47.0)
HEMOGLOBIN: 9 g/dL — AB (ref 12.0–16.0)
MCH: 30 pg (ref 26.0–34.0)
MCHC: 33.7 g/dL (ref 32.0–36.0)
MCV: 89.2 fL (ref 80.0–100.0)
PLATELETS: 142 10*3/uL — AB (ref 150–440)
RBC: 3 MIL/uL — AB (ref 3.80–5.20)
RDW: 13.6 % (ref 11.5–14.5)
WBC: 9.7 10*3/uL (ref 3.6–11.0)

## 2015-05-10 LAB — CK: Total CK: 14052 U/L — ABNORMAL HIGH (ref 38–234)

## 2015-05-10 LAB — PROCALCITONIN: PROCALCITONIN: 4.32 ng/mL

## 2015-05-10 MED ORDER — VANCOMYCIN HCL 10 G IV SOLR
1250.0000 mg | Freq: Once | INTRAVENOUS | Status: AC
  Administered 2015-05-10: 1250 mg via INTRAVENOUS
  Filled 2015-05-10: qty 1250

## 2015-05-10 MED ORDER — FREE WATER
200.0000 mL | Freq: Three times a day (TID) | Status: DC
  Administered 2015-05-10 – 2015-05-12 (×6): 200 mL

## 2015-05-10 MED ORDER — DEXTROSE 5 % IV SOLN
2.0000 g | Freq: Three times a day (TID) | INTRAVENOUS | Status: DC
  Administered 2015-05-10 – 2015-05-17 (×21): 2 g via INTRAVENOUS
  Filled 2015-05-10 (×23): qty 2

## 2015-05-10 MED ORDER — VITAL HIGH PROTEIN PO LIQD
1000.0000 mL | ORAL | Status: DC
Start: 2015-05-10 — End: 2015-05-11
  Administered 2015-05-10 – 2015-05-11 (×2): 1000 mL

## 2015-05-10 MED ORDER — PRO-STAT SUGAR FREE PO LIQD
30.0000 mL | Freq: Two times a day (BID) | ORAL | Status: DC
  Administered 2015-05-10 – 2015-05-14 (×9): 30 mL via ORAL

## 2015-05-10 MED ORDER — VANCOMYCIN HCL 10 G IV SOLR
1250.0000 mg | Freq: Two times a day (BID) | INTRAVENOUS | Status: DC
  Administered 2015-05-10 – 2015-05-11 (×3): 1250 mg via INTRAVENOUS
  Filled 2015-05-10 (×5): qty 1250

## 2015-05-10 NOTE — Consult Note (Signed)
39 yo RHD F presents to [facility] after taking like 90 pills of 100mg  Wellbutrin Pt failed extubation 10/10 due to pulmonary edema.  Last [facility] 10/8 not showing any acute intracranial abnormality   Past Medical History  Diagnosis Date  . Hypothyroidism   . Depression   . Anxiety     Past Surgical History  Procedure Laterality Date  . Cosmetic surgery      Family History  Problem Relation Age of Onset  . Depression Mother   . Drug abuse Mother   . Heart disease Father   . Alcohol abuse Father   . Depression Father   . Diabetes Father   . Thyroid disease Father   . Depression Sister   . Polycystic ovary syndrome Sister     Social History:  reports that she has never smoked. She has never used smokeless tobacco. She reports that she drinks about 0.6 oz of alcohol per week. She reports that she does not use illicit drugs.  No Known Allergies   Physical Examination: Blood pressure 105/67, pulse 100, temperature 100.1 F (37.8 C), temperature source Oral, resp. rate 26, height 5\' 5"  (1.651 m), weight 74.526 kg (164 lb 4.8 oz), SpO2 93 %. Pt appears to be agitated, not following commands.   Withdraws from pain bilaterally  Laboratory Studies:   Basic Metabolic Panel:  Recent Labs Lab 05/05/15 2100  06/04/15 1744 05/07/15 1610 05/08/15 0350 05/08/15 0508 05/08/15 1602 05/09/15 0917  NA  --   --  143 143 145  --  146* 143  144  K  --   --  2.9* 3.7 4.2  --  2.6* 3.1*  3.2*  CL  --   --  111 112* 114*  --  105 110  110  CO2  --   --  19* 22 22  --  32 30  31  GLUCOSE  --   --  114* 98 84  --  135* 122*  121*  BUN  --   --  12 13 11   --  16 16  16   CREATININE  --   --  0.86 0.77 0.91  --  1.35* 0.79  0.78  CALCIUM  --   < > 8.6* 8.5* 8.1*  --  6.7* 6.6*  6.8*  MG 2.0  --   --   --   --  2.0  --   --   < > = values in this interval not displayed.  Liver Function Tests:  Recent Labs Lab 2015-06-04 1744 05/08/15 1602 05/09/15 0917  AST 30 994*  1025* 833*   ALT 30 1044*  1093* 991*  ALKPHOS 51 52  54 52  BILITOT 1.0 1.9*  2.2* 1.2  PROT 7.0 5.2*  5.3* 4.7*  ALBUMIN 4.3 3.1*  3.0* 2.6*    Recent Labs Lab 2015/06/04 1744  LIPASE 25    Recent Labs Lab 05/09/15 1741  AMMONIA 34    CBC:  Recent Labs Lab June 04, 2015 1744 05/07/15 0619 05/08/15 0508 05/08/15 1602 05/09/15 0917 05/10/15 0510  WBC 10.4 19.2* 19.5* 23.7* 12.7* 9.7  NEUTROABS 6.7*  --  17.6*  --   --   --   HGB 14.2 13.3 10.7* 12.0 10.0* 9.0*  HCT 43.3 40.7 32.9* 36.2 30.3* 26.8*  MCV 87.1 86.9 88.4 85.8 88.2 89.2  PLT 298 271 212 258 139* 142*    Cardiac Enzymes:  Recent Labs Lab 05/08/15 1602 05/09/15 0917 05/09/15 1741  CKTOTAL 4378*  16109* 15266*    BNP: Invalid input(s): POCBNP  CBG:  Recent Labs Lab 05/08/15 0727 05/08/15 1131 05/08/15 1541 05/09/15 1933 05/10/15 0748  GLUCAP 95 94 118* 84 71    Microbiology: Results for orders placed or performed during the hospital encounter of 05/26/2015  MRSA PCR Screening     Status: None   Collection Time: 05/26/15 11:05 PM  Result Value Ref Range Status   MRSA by PCR NEGATIVE NEGATIVE Final    Comment:        The GeneXpert MRSA Assay (FDA approved for NASAL specimens only), is one component of a comprehensive MRSA colonization surveillance program. It is not intended to diagnose MRSA infection nor to guide or monitor treatment for MRSA infections.   Urine culture     Status: None   Collection Time: 05/07/15  8:32 AM  Result Value Ref Range Status   Specimen Description URINE, CATHETERIZED  Final   Special Requests Normal  Final   Culture NO GROWTH 2 DAYS  Final   Report Status 05/09/2015 FINAL  Final  Culture, blood (routine x 2)     Status: None (Preliminary result)   Collection Time: 05/07/15  8:43 AM  Result Value Ref Range Status   Specimen Description BLOOD RIGHT AC  Final   Special Requests BOTTLES DRAWN AEROBIC AND ANAEROBIC  7CC  Final   Culture NO GROWTH 2 DAYS  Final    Report Status PENDING  Incomplete  Culture, blood (routine x 2)     Status: None (Preliminary result)   Collection Time: 05/07/15  8:55 AM  Result Value Ref Range Status   Specimen Description BLOOD RIGHT HAND  Final   Special Requests   Final    BOTTLES DRAWN AEROBIC AND ANAEROBIC  AER 7CC ANA 10CC   Culture NO GROWTH 2 DAYS  Final   Report Status PENDING  Incomplete    Coagulation Studies:  Recent Labs  05/08/15 0508  LABPROT 15.6*  INR 1.22    Urinalysis:  Recent Labs Lab May 26, 2015 1744  COLORURINE YELLOW*  LABSPEC 1.017  PHURINE 5.0  GLUCOSEU NEGATIVE  HGBUR NEGATIVE  BILIRUBINUR NEGATIVE  KETONESUR TRACE*  PROTEINUR NEGATIVE  NITRITE NEGATIVE  LEUKOCYTESUR NEGATIVE    Lipid Panel:     Component Value Date/Time   TRIG 123 05/09/2015 1741    HgbA1C: No results found for: HGBA1C  Urine Drug Screen:     Component Value Date/Time   LABOPIA NONE DETECTED 26-May-2015 1744   LABBENZ POSITIVE* May 26, 2015 1744   AMPHETMU NONE DETECTED 26-May-2015 1744   THCU NONE DETECTED May 26, 2015 1744   LABBARB NONE DETECTED May 26, 2015 1744    Alcohol Level:  Recent Labs Lab May 26, 2015 1744  ETH 6*     Imaging: Dg Abd 1 View  05/09/2015   CLINICAL DATA:  Orogastric tube placement.  EXAM: ABDOMEN - 1 VIEW  COMPARISON:  May 07, 2015.  FINDINGS: The bowel gas pattern is normal. Distal tip of orogastric tube is seen in distal stomach. No radio-opaque calculi or other significant radiographic abnormality are seen.  IMPRESSION: Distal tip of orogastric tube is seen in distal stomach. No evidence of bowel obstruction or ileus is noted.   Electronically Signed   By: Lupita Raider, M.D.   On: 05/09/2015 16:06   Portable Chest Xray  05/10/2015   CLINICAL DATA:  Intubation.  EXAM: PORTABLE CHEST 1 VIEW  COMPARISON:  05/09/2015.  FINDINGS: Endotracheal tube, right IJ line in stable position. Heart size stable. Slight  interim clearing of diffuse bilateral pulmonary infiltrates.  Small left pleural effusion cannot be excluded. No pneumothorax.  IMPRESSION: 1. Lines and tubes in stable position.  2. Persistent diffuse bilateral pulmonary infiltrates with slight interim clearing from prior exam . Small left pleural effusion cannot be excluded.   Electronically Signed   By: Maisie Fus  Register   On: 05/10/2015 07:32   Portable Chest Xray  05/09/2015   CLINICAL DATA:  Post intubation  EXAM: PORTABLE CHEST 1 VIEW  COMPARISON:  05/08/2015  FINDINGS: Endotracheal tube is 3 cm above the carina. Right central line tip near the cavoatrial junction. NG tube is in the stomach.  Worsening bilateral perihilar and lower lobe opacities, presumably edema. Suspect layering effusions. Heart is upper limits normal in size.  IMPRESSION: Endotracheal tube 3 cm above the carina.  New moderate bilateral airspace opacities in the perihilar and lower lobe regions concerning for edema.   Electronically Signed   By: Charlett Nose M.D.   On: 05/09/2015 16:05   Dg Chest Port 1 View  05/08/2015   CLINICAL DATA:  Rule out pneumothorax, status post resuscitation.  EXAM: PORTABLE CHEST 1 VIEW  COMPARISON:  05/08/2015  FINDINGS: Right-sided internal jugular approach central venous catheter, endotracheal tube, and shock pads are in stable position.  Cardiomediastinal silhouette is normal. Mediastinal contours appear intact.  There is no evidence of pneumothorax. There are diffusely increased interstitial markings, with possible development of airspace consolidation in the right lower lobe. There is probable left lower lobe atelectasis.  Osseous structures are without acute abnormality. Soft tissues are grossly normal.  IMPRESSION: Stable positioning of supporting lines and tubes.  Interval development of increased interstitial markings, suggestive of developing pulmonary edema.  Possible development of right lower lobe airspace consolidation, and left lower lobe atelectasis.   Electronically Signed   By: Ted Mcalpine  M.D.   On: 05/08/2015 10:36     Assessment/Plan:   39 yo RHD F presents to [facility] after taking like 90 pills of  Wellbutrin Pt failed extubation 10/10 due to pulmonary edema.  Last [facility] 10/8 not showing any acute intracranial abnormality EEG 10/10 no evidence of seizures but diffuse spindle coma which is seen in severe encephalopathy. Moves all extremities but no purpose to movements.    Despite improvement on examination would like to see repeat CTH in next 1-2 days Con't same dose of Keppra 1000 BID    05/10/2015, 10:17 AM

## 2015-05-10 NOTE — Progress Notes (Signed)
[place] at [place] NAME: Kathleen Gray    MR#:  [id number]  DATE OF BIRTH:  03-Dec-1975  SUBJECTIVE:  CHIEF COMPLAINT:   Chief Complaint  Patient presents with  . Suicidal  . Drug Overdose   Failed extubation yesterday, now back on vent support, opens eyes to voice  REVIEW OF SYSTEMS:   ROS unable to obtain due to AMS  DRUG ALLERGIES:  No Known Allergies  VITALS:  Blood pressure 116/77, pulse 102, temperature 100.1 F (37.8 C), temperature source Oral, resp. rate 27, height 5' 5"  (1.651 m), weight 74.526 kg (164 lb 4.8 oz), SpO2 93 %.  PHYSICAL EXAMINATION:  GENERAL:  39 y.o.-year-old patient lying in the bed, sedated on vent EYES: Pupils equal, round, not reactive to light and accommodation. No scleral icterus.  HEENT: Head atraumatic, normocephalic.  LUNGS: coarse ventilated breath sounds, fair air movement, no distress CARDIOVASCULAR: S1, S2 normal. No murmurs, rubs, or gallops.  ABDOMEN: Soft, nontender, nondistended. Bowel sounds present. No organomegaly or mass.  EXTREMITIES: No pedal edema, cyanosis, or clubbing.  NEUROLOGIC: sedated opens eyes to voice PSYCHIATRIC: sedated  SKIN: No obvious rash, lesion, or ulcer.   LABORATORY PANEL:   CBC  Recent Labs Lab 05/10/15 0510  WBC 9.7  HGB 9.0*  HCT 26.8*  PLT 142*   ------------------------------------------------------------------------------------------------------------------  Chemistries   Recent Labs Lab 05/08/15 0508  05/10/15 1024  NA  --   < > 141  K  --   < > 3.4*  CL  --   < > 111  CO2  --   < > 29  GLUCOSE  --   < > 73  BUN  --   < > 15  CREATININE  --   < > 0.65  CALCIUM  --   < > 7.2*  MG 2.0  --   --   AST  --   < > 764*  ALT  --   < > 1099*  ALKPHOS  --   < > 63  BILITOT  --   < > 1.0  < > = values in this interval not  displayed. ------------------------------------------------------------------------------------------------------------------  Cardiac Enzymes No results for input(s): TROPONINI in the last 168 hours. ------------------------------------------------------------------------------------------------------------------  RADIOLOGY:  Dg Abd 1 View  05/09/2015   CLINICAL DATA:  Orogastric tube placement.  EXAM: ABDOMEN - 1 VIEW  COMPARISON:  May 07, 2015.  FINDINGS: The bowel gas pattern is normal. Distal tip of orogastric tube is seen in distal stomach. No radio-opaque calculi or other significant radiographic abnormality are seen.  IMPRESSION: Distal tip of orogastric tube is seen in distal stomach. No evidence of bowel obstruction or ileus is noted.   Electronically Signed   By: Marijo Conception, M.D.   On: 05/09/2015 16:06   Portable Chest Xray  05/10/2015   CLINICAL DATA:  Intubation.  EXAM: PORTABLE CHEST 1 VIEW  COMPARISON:  05/09/2015.  FINDINGS: Endotracheal tube, right IJ line in stable position. Heart size stable. Slight interim clearing of diffuse bilateral pulmonary infiltrates. Small left pleural effusion cannot be excluded. No pneumothorax.  IMPRESSION: 1. Lines and tubes in stable position.  2. Persistent diffuse bilateral pulmonary infiltrates with slight interim clearing from prior exam . Small left pleural effusion cannot be excluded.   Electronically Signed   By: Marcello Moores  Register   On: 05/10/2015 07:32   Portable Chest Xray  05/09/2015   CLINICAL DATA:  Post intubation  EXAM: PORTABLE  CHEST 1 VIEW  COMPARISON:  05/08/2015  FINDINGS: Endotracheal tube is 3 cm above the carina. Right central line tip near the cavoatrial junction. NG tube is in the stomach.  Worsening bilateral perihilar and lower lobe opacities, presumably edema. Suspect layering effusions. Heart is upper limits normal in size.  IMPRESSION: Endotracheal tube 3 cm above the carina.  New moderate bilateral airspace  opacities in the perihilar and lower lobe regions concerning for edema.   Electronically Signed   By: Rolm Baptise M.D.   On: 05/09/2015 16:05    EKG:   Orders placed or performed during the hospital encounter of 05/29/2015  . ED EKG  . ED EKG    ASSESSMENT AND PLAN:   1) status epilepticus after bupropion overdose - resolved, no seizure on EEG - encephalopathy on EEG concerning, but has followed commands this morning - neurology following - continue keppra  2) Vtach cardiac arrest -  due to lactic acidosis due to #1 -  Acidosis corrected  - ECHO with EF 40-45%, will need evaluation once stable  3) leucocytosis with fever -  likely due to seizure, now improving -  Blood and urine cultures NTD - CXR with possible consolidation, started vanc/zosy for HCAP on 10/9  4) anemia - hgb drop 13--10> 9 , minimal bleeding from mouth noted. Monitor  5) rhabdomyolysis after seizure - CK 4000 >> 11000 >> 14052 - continue hydration, monitor renal function  6) acute respiratory failure - re-intubated yesterday due to pulmonary edema, continues on vent suport  7) transaminitis - due to hypotension, overdose   CODE STATUS: full  TOTAL TIME TAKING CARE OF THIS PATIENT: 30 minutes.  Greater than 50% of time spent in care coordination and counseling. Met with family multiple times for updates and care planning.  POSSIBLE D/C IN ? DAYS, DEPENDING ON CLINICAL CONDITION.   Myrtis Ser M.D on 05/10/2015 at 3:39 PM  Between 7am to 6pm - Pager - [phone]  After 6pm go to [email or web address] - password [email or web address] [facility]  [facility]  Office  [phone]  CC: Primary care physician; No PCP Per Patient

## 2015-05-10 NOTE — Progress Notes (Signed)
Palliative Care Update  I spoke with Dr. Sung Amabile (Critical Care) today about patient and he feels there is not a need for a palliative care involvement at this time--- since the goals of care are for full recovery using whatever aggressive measures are indicated.    I will be glad to become  involved if patient's status changes and the goals become less clear.  Generally, Palliative Care involvement is indicated if there are difficult choices that need to be made or if clarity  Is needed regarding extent of aggressive care desired.    At this time, she will be getting all the aggressive care indicated, as the goal is for full recovery.     Suan Halter, MD

## 2015-05-10 NOTE — Progress Notes (Signed)
Nutrition Follow-up   INTERVENTION:   EN: Adult Tube Feeding Protocol Entered with Vital High Protein to start at 20 ml/hr with goal of 40 ml/hr; noted diprivan started for sedation. Recommend continuing TF as ordered per protocol, will assess goal rate once pt on diprivan for at least 24 hours and accurately able to assess calories provided from diprivan. Recommend addition of Prostat BID (additional 15 g of protein, 100 kcals per packet) to maximize protein intake. Continue to assess   NUTRITION DIAGNOSIS:   Inadequate oral intake related to acute illness as evidenced by NPO status.  Being addressed via TF  GOAL:   Provide needs based on ASPEN/SCCM guidelines  MONITOR:    (Energy Intake, Anthropometrics, Electrolyte/Renal Profile, Glucose Profile, Digestive System)  REASON FOR ASSESSMENT:   Ventilator    ASSESSMENT:    Pt failed extubation yesterday requiring reintubation yesterday afternoon, currently sedated on vent, on diprivan  Diet Order:   NPO  EN: Adult Tube Feeding Protocol entered by MD this AM  Digestive System: OG tube in place  Electrolyte and Renal Profile:  Recent Labs Lab 05/05/15 2100  05/08/15 0350 05/08/15 0508 05/08/15 1602 05/09/15 0917  BUN  --   < > 11  --  CREATININE  --   < > 0.91  --  1.35* 0.79  0.78  NA  --   < > 145  --  146* 143  144  K  --   < > 4.2  --  2.6* 3.1*  3.2*  MG 2.0  --   --  2.0  --   --   < > = values in this interval not displayed.  Glucose Profile:   Recent Labs  05/09/15 1933 05/10/15 0748 05/10/15 1209  GLUCAP 84 71 73   Meds: diprivan, lactulose  Height:   Ht Readings from Last 1 Encounters:  05/10/2015  (1.651 m)    Weight:   Wt Readings from Last 1 Encounters:  05/10/2015 164 lb 4.8 oz (74.526 kg)   BMI:  Body mass index is 27.34 kg/(m^2).  Estimated Nutritional Needs:   Kcal:  1846 kcals (BEE: 1466, Ve: 12, Tmax: 37.8) using current wt of 74.5  Protein:  89-149 g  (1.2-2.0 g/kg)   Fluid:  1875-2250 mL (25-30 ml/kg)   HIGH Care Level  Romelle Starcher MS, RD, LDN [phone] Pager

## 2015-05-10 NOTE — Progress Notes (Signed)
ANTIBIOTIC CONSULT NOTE - INITIAL  Pharmacy Consult for Vancomycin and Ceftazidime Indication: pneumonia  No Known Allergies  Patient Measurements: Height:  (165.1 cm) Weight: 164 lb 4.8 oz (74.526 kg) IBW/kg (Calculated) : 57 Adjusted Body Weight: 63.8 kg  Vital Signs: Temp: 100.1 F (37.8 C) (10/11 0730) Temp Source: Oral (10/11 0730) BP: 105/67 mmHg (10/11 0900) Pulse Rate: 100 (10/11 0900) Intake/Output from previous day: 10/10 0701 - 10/11 0700 In: 1073.3 [I.V.:963.3; IV Piggyback:110] Out: 1100 [Urine:1100] Intake/Output from this shift: Total I/O In: 117.8 [I.V.:117.8] Out: -   Labs:  Recent Labs  05/08/15 0350  05/08/15 1602 05/09/15 0917 05/10/15 0510  WBC  --   < > 23.7* 12.7* 9.7  HGB  --   < > 12.0 10.0* 9.0*  PLT  --   < > 258 139* 142*  CREATININE 0.91  --  1.35* 0.79  0.78  --   < > = values in this interval not displayed. Estimated Creatinine Clearance: 97.3 mL/min (by C-G formula based on Cr of 0.78). No results for input(s): VANCOTROUGH, VANCOPEAK, VANCORANDOM, GENTTROUGH, GENTPEAK, GENTRANDOM, TOBRATROUGH, TOBRAPEAK, TOBRARND, AMIKACINPEAK, AMIKACINTROU, AMIKACIN in the last 72 hours.   Microbiology: Recent Results (from the past 720 hour(s))  MRSA PCR Screening     Status: None   Collection Time: 05/28/2015 11:05 PM  Result Value Ref Range Status   MRSA by PCR NEGATIVE NEGATIVE Final    Comment:        The GeneXpert MRSA Assay (FDA approved for NASAL specimens only), is one component of a comprehensive MRSA colonization surveillance program. It is not intended to diagnose MRSA infection nor to guide or monitor treatment for MRSA infections.   Urine culture     Status: None   Collection Time: 05/07/15  8:32 AM  Result Value Ref Range Status   Specimen Description URINE, CATHETERIZED  Final   Special Requests Normal  Final   Culture NO GROWTH 2 DAYS  Final   Report Status 05/09/2015 FINAL  Final  Culture, blood (routine x 2)      Status: None (Preliminary result)   Collection Time: 05/07/15  8:43 AM  Result Value Ref Range Status   Specimen Description BLOOD RIGHT AC  Final   Special Requests BOTTLES DRAWN AEROBIC AND ANAEROBIC  7CC  Final   Culture NO GROWTH 2 DAYS  Final   Report Status PENDING  Incomplete  Culture, blood (routine x 2)     Status: None (Preliminary result)   Collection Time: 05/07/15  8:55 AM  Result Value Ref Range Status   Specimen Description BLOOD RIGHT HAND  Final   Special Requests   Final    BOTTLES DRAWN AEROBIC AND ANAEROBIC  AER 7CC ANA 10CC   Culture NO GROWTH 2 DAYS  Final   Report Status PENDING  Incomplete    Medical History: Past Medical History  Diagnosis Date  . Hypothyroidism   . Depression   . Anxiety      Assessment: 39 yo female admitted for bupropion OD, acute respiratory failure and seizures. Patient failed extubation on 10/10. CXR today shows persistent diffuse bilateral pulmonary infiltrates. Pharmacy consulted for dosing and monitoring of Vancomycin and Ceftazidime for possible HAP.     Crcl 97.3, Scr 0.79, Tmax last 24 hours 100.1, respiratory culture pending.   Goal of Therapy:  Vancomycin trough level 15-20 mcg/ml  Plan:  Will start patient on Ceftazidime 2g every 8 hours.  Will initiate vancomycin  every 12 hours, starting  with stack dose of  now, and beginning regimen at 2000 (~7-8 hr) tonight.   Ke: 0.0857    T1/2: 8    Vd: 44.8  Trough ordered prior to 4th regimen dose on 10/13 @ 0730.  Scr ordered for 0500 tomorrow AM. Pharmacy will continue to monitor patients labs and renal function and make adjustments as needed

## 2015-05-10 NOTE — Progress Notes (Addendum)
Pt profile: 65 F with multiple previous suicide attempts took large OD of bupropion 10/07 c/b acute resp failure, protracted seizures, shock. Improved 10/10 and able to F/C intermittently. Failed extubation 10/10 with pulm edema pattern on CXR after re-intubation  Active problems: VDRF Pulm edema Possible aspiration Failed extubation 10/10 Cardiomyopathy (LVEF 40%) Shock, resolved Hypokalemia Acute encephalopathy Seizures due to bupropion OD   Lines, Tubes, etc: ETT 10/07 >> 10/10, 10/10 >>  R IJ CVL 10/09 >>  R femoral A-line 10/09 >>   Microbiology: MRSA PCR 10/07 >> NEG Urine 10/08 >> NEG Resp 10/10 >>  Blood 10/08 >>  PCT 10/11 >>   Antibiotics:  Vanc 10/09 >> 10/10 Pip-tazo 10/09 >> 10/10  Studies/Events: 10/08 CT head: normal 10/09 TTE: LVEF 40-45% 10/10 Failed extubation. 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  Consults:  PCCM 10/08 Neuro 10/08   Best Practice: DVT: LMWH SUP: PPI Nutrition: TFs 10/11 Glycemic control: no SSI indicated Sedation/analgesia: Propofol  Subj: RASS -2, + F/C with some effort by examiner  Obj: Filed Vitals:   05/10/15 1400  BP: 116/77  Pulse: 102  Temp:   Resp: 27    Gen: WDWN, RASS -2, intubated HEENT: NCAT, EOMI, PERRL Neck: No JVD Chest: diffuse rhonchi Cardiac: reg, no M Abd: soft, NT Ext: warm, no edema Neuro: MAEs vigorously to stimulation  BMET BMP Latest Ref Rng 05/10/2015 05/10/2015 05/09/2015  Glucose 65 - 99 mg/dL 73 73 161(W)  BUN 6 - 20 mg/dL Creatinine 0.44 - 1.00 mg/dL 9.60 4.54 0.98  Sodium 135 - 145 mmol/L 141 142 143  Potassium 3.5 - 5.1 mmol/L 3.4(L) 3.4(L) 3.1(L)  Chloride 101 - 111 mmol/L 111 110 110  CO2 22 - 32 mmol/L Calcium 8.9 - 10.3 mg/dL 7.2(L) 7.2(L) 6.6(L)     CBC CBC Latest Ref Rng 05/10/2015 05/09/2015 05/08/2015  WBC 3.6 - 11.0 K/uL 9.7 12.7(H) 23.7(H)   Hemoglobin 12.0 - 16.0 g/dL 9.0(L) 10.0(L) 12.0  Hematocrit 35.0 - 47.0 % 26.8(L) 30.3(L) 36.2  Platelets 150 - 440 K/uL 142(L) 139(L) 258      CXR: edema pattern   IMPRESSION: VDRF Failed extubation 10/10 pulm edema Possible aspiration Cardiomyopathy - likely toxic Hypokalemia Rhabdomyolysis Lactic acidosis, resolved Elevated LFTs Severe TME, Likely toxic, possible hepatic in origin  PLAN/RECS:  Cont full vent support - settings reviewed and/or adjusted Cont vent bundle Daily SBT if/when meets criteria MAP goal > 60 mmHg Monitor BMET intermittently Monitor I/Os Correct electrolytes as indicated Cont SUP and DVT px Consider TFs 10/11 Cont anticonvulsants per Neuro Family updated at bedside Discussed with Dr Clent Ridges  CCM time: 40 mins The above time includes time spent in consultation with patient and/or family members and reviewing care plan on multidisciplinary rounds  Billy Fischer, MD PCCM service Mobile [phone] Pager [phone]

## 2015-05-11 ENCOUNTER — Inpatient Hospital Stay: Payer: Medicaid Other

## 2015-05-11 DIAGNOSIS — T50902S Poisoning by unspecified drugs, medicaments and biological substances, intentional self-harm, sequela: Secondary | ICD-10-CM

## 2015-05-11 LAB — COMPREHENSIVE METABOLIC PANEL
ALT: 1895 U/L — ABNORMAL HIGH (ref 14–54)
ANION GAP: 6 (ref 5–15)
AST: 1247 U/L — ABNORMAL HIGH (ref 15–41)
Albumin: 2.5 g/dL — ABNORMAL LOW (ref 3.5–5.0)
Alkaline Phosphatase: 82 U/L (ref 38–126)
BUN: 15 mg/dL (ref 6–20)
CHLORIDE: 113 mmol/L — AB (ref 101–111)
CO2: 27 mmol/L (ref 22–32)
Calcium: 7.8 mg/dL — ABNORMAL LOW (ref 8.9–10.3)
Creatinine, Ser: 0.88 mg/dL (ref 0.44–1.00)
GFR calc non Af Amer: >60 mL/min (ref >60)
Glucose, Bld: 87 mg/dL (ref 65–99)
POTASSIUM: 3.2 mmol/L — AB (ref 3.5–5.1)
SODIUM: 146 mmol/L — AB (ref 135–145)
Total Bilirubin: 1.1 mg/dL (ref 0.3–1.2)
Total Protein: 5.4 g/dL — ABNORMAL LOW (ref 6.5–8.1)

## 2015-05-11 LAB — HAPTOGLOBIN: Haptoglobin: 182 mg/dL (ref 34–200)

## 2015-05-11 LAB — CBC
HCT: 26.2 % — ABNORMAL LOW (ref 35.0–47.0)
Hemoglobin: 8.4 g/dL — ABNORMAL LOW (ref 12.0–16.0)
MCH: 28.6 pg (ref 26.0–34.0)
MCHC: 32.1 g/dL (ref 32.0–36.0)
MCV: 89 fL (ref 80.0–100.0)
PLATELETS: 147 10*3/uL — AB (ref 150–440)
RBC: 2.94 MIL/uL — AB (ref 3.80–5.20)
RDW: 13.7 % (ref 11.5–14.5)
WBC: 12.8 10*3/uL — ABNORMAL HIGH (ref 3.6–11.0)

## 2015-05-11 LAB — BRAIN NATRIURETIC PEPTIDE: B NATRIURETIC PEPTIDE 5: 83 pg/mL (ref 0.0–100.0)

## 2015-05-11 LAB — GLUCOSE, CAPILLARY
GLUCOSE-CAPILLARY: 97 mg/dL (ref 65–99)
GLUCOSE-CAPILLARY: 71 mg/dL (ref 65–99)
GLUCOSE-CAPILLARY: 101 mg/dL — AB (ref 65–99)
Glucose-Capillary: 113 mg/dL — ABNORMAL HIGH (ref 65–99)

## 2015-05-11 LAB — CULTURE, RESPIRATORY W GRAM STAIN

## 2015-05-11 LAB — CK: Total CK: 5905 U/L — ABNORMAL HIGH (ref 38–234)

## 2015-05-11 LAB — CULTURE, RESPIRATORY: CULTURE: NORMAL

## 2015-05-11 MED ORDER — VITAL 1.5 CAL PO LIQD
1000.0000 mL | ORAL | Status: DC
  Administered 2015-05-11 – 2015-05-13 (×3): 1000 mL

## 2015-05-11 MED ORDER — POTASSIUM CHLORIDE 20 MEQ/15ML (10%) PO SOLN
40.0000 meq | Freq: Two times a day (BID) | ORAL | Status: AC
  Administered 2015-05-11 (×2): 40 meq
  Filled 2015-05-11 (×2): qty 30

## 2015-05-11 MED ORDER — SODIUM CHLORIDE 0.9 % IV SOLN
INTRAVENOUS | Status: DC
  Administered 2015-05-11 – 2015-05-12 (×3): via INTRAVENOUS

## 2015-05-11 MED ORDER — DEXMEDETOMIDINE HCL IN NACL 400 MCG/100ML IV SOLN
0.4000 ug/kg/h | INTRAVENOUS | Status: DC
  Administered 2015-05-11: 0.4 ug/kg/h via INTRAVENOUS
  Administered 2015-05-11 – 2015-05-12 (×2): 1.1 ug/kg/h via INTRAVENOUS
  Administered 2015-05-12: 1.8 ug/kg/h via INTRAVENOUS
  Administered 2015-05-12: 1.1 ug/kg/h via INTRAVENOUS
  Administered 2015-05-12: 1.9 ug/kg/h via INTRAVENOUS
  Administered 2015-05-12 (×3): 2 ug/kg/h via INTRAVENOUS
  Administered 2015-05-13: 1.7 ug/kg/h via INTRAVENOUS
  Administered 2015-05-13: 1.8 ug/kg/h via INTRAVENOUS
  Administered 2015-05-13: 1.6 ug/kg/h via INTRAVENOUS
  Administered 2015-05-13 (×2): 1.5 ug/kg/h via INTRAVENOUS
  Administered 2015-05-14 (×3): 1.8 ug/kg/h via INTRAVENOUS
  Filled 2015-05-11 (×20): qty 100

## 2015-05-11 MED ORDER — LORAZEPAM 2 MG/ML IJ SOLN
0.5000 mg | INTRAMUSCULAR | Status: DC | PRN
Start: 2015-05-11 — End: 2015-05-19
  Administered 2015-05-11: 0.5 mg via INTRAVENOUS
  Administered 2015-05-11: 1 mg via INTRAVENOUS
  Administered 2015-05-11 – 2015-05-12 (×2): 0.5 mg via INTRAVENOUS
  Administered 2015-05-13 – 2015-05-14 (×4): 1 mg via INTRAVENOUS
  Filled 2015-05-11 (×8): qty 1

## 2015-05-11 NOTE — Progress Notes (Signed)
RN spoke to E Link about continued difficulties throughout afternoon maintaining patient's oxygen levels. RN and RT unsure if new chest needed or ABG or diuresis. E Link RN told RN that Glade NurseE Link MD would look at patient again and see what if anything to do for patient.

## 2015-05-11 NOTE — Progress Notes (Signed)
Nutrition Follow-up    INTERVENTION:   EN: diprivan being weaned off. Without kcals from diprivan, recommend changing to Vital 1.5 TF at rate of 45 ml/hr, continue Prostat BID. Provides 1820 kcals, 103 g of protein, 821 mL of free water. Pt with free water flush of 200 mL q 4 hours per MD at present. Continue to assess   NUTRITION DIAGNOSIS:   Inadequate oral intake related to acute illness as evidenced by NPO status. Being addressed via TF  GOAL:   Provide needs based on ASPEN/SCCM guidelines  MONITOR:    (Energy Intake, Anthropometrics, Electrolyte/Renal Profile, Glucose Profile, Digestive System)  REASON FOR ASSESSMENT:   Ventilator    ASSESSMENT:    Pt remains on vent, weaning of diprivan, starting precedex  EN: TF not infusing this AM due to issues with feeding pump; feeding pump changed out and Vital High Protein now infusing at 40 ml/hr. TF infusing during the night per nsg notes.   Digestive System: no signs of TF intolerance, no residuals documented, abdomen soft, BS hypoactive  Electrolyte and Renal Profile:  Recent Labs Lab 05/05/15 2100  05/08/15 0508  05/10/15 0510 05/10/15 1024 05/11/15 0500  BUN  --   < >  --   < > 14 15 15   CREATININE  --   < >  --   < > 0.69 0.65 0.88  NA  --   < >  --   < > 142 141 146*  K  --   < >  --   < > 3.4* 3.4* 3.2*  MG 2.0  --  2.0  --   --   --   --   < > = values in this interval not displayed. Electrolyte and Renal Profile:  Glucose Profile:  Recent Labs  05/10/15 2358 05/11/15 0726 05/11/15 1233  GLUCAP 86 97 71   Meds: lactulose, NS at 100 ml/hr  Last BM:   no BM documented  Height:   Ht Readings from Last 1 Encounters:  2014/11/18 5\' 5"  (1.651 m)    Weight:   Wt Readings from Last 1 Encounters:  05/11/15 166 lb 0.1 oz (75.3 kg)    Filed Weights   2014/11/18 1756 05/11/15 0559  Weight: 164 lb 4.8 oz (74.526 kg) 166 lb 0.1 oz (75.3 kg)    BMI:  Body mass index is 27.62 kg/(m^2).  Estimated  Nutritional Needs:   Kcal:  1846 kcals (BEE: 1466, Ve: 12, Tmax: 37.8) using current wt of 74.5  Protein:  89-149 g (1.2-2.0 g/kg)   Fluid:  1875-2250 mL (25-30 ml/kg)   HIGH Care Level  Romelle Starcherate Mercades Bajaj MS, RD, LDN [phone] Pager

## 2015-05-11 NOTE — Progress Notes (Signed)
Ptremaqins on vent.vent settings as charted. Sedated with propofol. Open eyes to name . Attempt to follow simple commands. Tube feeding is in progress. Tolerating good. Foley in place. UOp good. Resting comfortably in bed without any distress. Continue to observe closely.

## 2015-05-11 NOTE — Consult Note (Signed)
39 yo RHD F presents to [facility] after taking like 90 pills of  Wellbutrin Pt failed extubation 10/10 due to pulmonary edema.  Last [facility] 10/8 not showing any acute intracranial abnormality s/p repeat CTH today   Past Medical History  Diagnosis Date  . Hypothyroidism   . Depression   . Anxiety     Past Surgical History  Procedure Laterality Date  . Cosmetic surgery      Family History  Problem Relation Age of Onset  . Depression Mother   . Drug abuse Mother   . Heart disease Father   . Alcohol abuse Father   . Depression Father   . Diabetes Father   . Thyroid disease Father   . Depression Sister   . Polycystic ovary syndrome Sister     Social History:  reports that she has never smoked. She has never used smokeless tobacco. She reports that she drinks about 0.6 oz of alcohol per week. She reports that she does not use illicit drugs.  No Known Allergies   Physical Examination: Blood pressure 117/83, pulse 93, temperature 100.1 F (37.8 C), temperature source Oral, resp. rate 28, height  (1.651 m), weight 75.3 kg (166 lb 0.1 oz), SpO2 95 %. Pt appears to be agitated, not following commands.   Withdraws from pain bilaterally  Laboratory Studies:   Basic Metabolic Panel:  Recent Labs Lab 05/05/15 2100  05/08/15 0508 05/08/15 1602 05/09/15 0917 05/10/15 0510 05/10/15 1024 05/11/15 0500  NA  --   < >  --  146* 143  144 142 141 146*  K  --   < >  --  2.6* 3.1*  3.2* 3.4* 3.4* 3.2*  CL  --   < >  --  105 110  110 110 111 113*  CO2  --   < >  --  32 GLUCOSE  --   < >  --  135* 122*  121* 73 73 87  BUN  --   < >  --  CREATININE  --   < >  --  1.35* 0.79  0.78 0.69 0.65 0.88  CALCIUM  --   < >  --  6.7* 6.6*  6.8* 7.2* 7.2* 7.8*  MG 2.0  --  2.0  --   --   --   --   --   < > = values in this interval not displayed.  Liver Function Tests:  Recent Labs Lab 05/08/15 1602 05/09/15 0917 05/10/15 0510 05/10/15 1024  05/11/15 0500  AST 994*  1025* 833* 699* 764* 1247*  ALT 1044*  1093* 991* 999* 1099* 1895*  ALKPHOS 52  54 52 52 63 82  BILITOT 1.9*  2.2* 1.2 0.5 1.0 1.1  PROT 5.2*  5.3* 4.7* 4.8* 5.2* 5.4*  ALBUMIN 3.1*  3.0* 2.6* 2.7* 2.6* 2.5*    Recent Labs Lab 05/15/2015 1744  LIPASE 25    Recent Labs Lab 05/09/15 1741  AMMONIA 34    CBC:  Recent Labs Lab 05/09/2015 1744  05/08/15 0508 05/08/15 1602 05/09/15 0917 05/10/15 0510 05/11/15 0500  WBC 10.4  < > 19.5* 23.7* 12.7* 9.7 12.8*  NEUTROABS 6.7*  --  17.6*  --   --   --   --   HGB 14.2  < > 10.7* 12.0 10.0* 9.0* 8.4*  HCT 43.3  < > 32.9* 36.2 30.3* 26.8* 26.2*  MCV  87.1  < > 88.4 85.8 88.2 89.2 89.0  PLT 298  < > 212 258 139* 142* 147*  < > = values in this interval not displayed.  Cardiac Enzymes:  Recent Labs Lab 05/08/15 1602 05/09/15 0917 05/09/15 1741 05/10/15 0510 05/11/15 0500  CKTOTAL 4378* 40981* 15266* 14052* 5905*    BNP: Invalid input(s): POCBNP  CBG:  Recent Labs Lab 05/10/15 2358 05/11/15 0726 05/11/15 1233 05/11/15 1814 05/11/15 1950  GLUCAP 86 97 71 113* 101*    Microbiology: Results for orders placed or performed during the hospital encounter of 05/05/2015  MRSA PCR Screening     Status: None   Collection Time: 05/07/2015 11:05 PM  Result Value Ref Range Status   MRSA by PCR NEGATIVE NEGATIVE Final    Comment:        The GeneXpert MRSA Assay (FDA approved for NASAL specimens only), is one component of a comprehensive MRSA colonization surveillance program. It is not intended to diagnose MRSA infection nor to guide or monitor treatment for MRSA infections.   Urine culture     Status: None   Collection Time: 05/07/15  8:32 AM  Result Value Ref Range Status   Specimen Description URINE, CATHETERIZED  Final   Special Requests Normal  Final   Culture NO GROWTH 2 DAYS  Final   Report Status 05/09/2015 FINAL  Final  Culture, blood (routine x 2)     Status: None (Preliminary  result)   Collection Time: 05/07/15  8:43 AM  Result Value Ref Range Status   Specimen Description BLOOD RIGHT AC  Final   Special Requests BOTTLES DRAWN AEROBIC AND ANAEROBIC  7CC  Final   Culture NO GROWTH 4 DAYS  Final   Report Status PENDING  Incomplete  Culture, blood (routine x 2)     Status: None (Preliminary result)   Collection Time: 05/07/15  8:55 AM  Result Value Ref Range Status   Specimen Description BLOOD RIGHT HAND  Final   Special Requests   Final    BOTTLES DRAWN AEROBIC AND ANAEROBIC  AER 7CC ANA 10CC   Culture NO GROWTH 4 DAYS  Final   Report Status PENDING  Incomplete  Culture, respiratory (NON-Expectorated)     Status: None   Collection Time: 05/09/15  3:47 PM  Result Value Ref Range Status   Specimen Description TRACHEAL ASPIRATE  Final   Special Requests NONE  Final   Gram Stain   Final    EXCELLENT SPECIMEN - 90-100% WBCS MODERATE WBC SEEN RARE GRAM POSITIVE COCCI    Culture APPEARS TO BE NORMAL RESPIRATORY FLORA  Final   Report Status 05/11/2015 FINAL  Final    Coagulation Studies: No results for input(s): LABPROT, INR in the last 72 hours.  Urinalysis:   Recent Labs Lab 05/28/2015 1744  COLORURINE YELLOW*  LABSPEC 1.017  PHURINE 5.0  GLUCOSEU NEGATIVE  HGBUR NEGATIVE  BILIRUBINUR NEGATIVE  KETONESUR TRACE*  PROTEINUR NEGATIVE  NITRITE NEGATIVE  LEUKOCYTESUR NEGATIVE    Lipid Panel:     Component Value Date/Time   TRIG 123 05/09/2015 1741    HgbA1C: No results found for: HGBA1C  Urine Drug Screen:      Component Value Date/Time   LABOPIA NONE DETECTED 05/10/2015 1744   LABBENZ POSITIVE* 05/25/2015 1744   AMPHETMU NONE DETECTED 05/24/2015 1744   THCU NONE DETECTED 05/25/2015 1744   LABBARB NONE DETECTED 05/05/2015 1744    Alcohol Level:   Recent Labs Lab 05/04/2015 1744  ETH 6*  Imaging: Ct Head Wo Contrast  05/11/2015  CLINICAL DATA:  Recent overdose on Wellbutrin, anoxic brain injury EXAM: CT HEAD WITHOUT CONTRAST  TECHNIQUE: Contiguous axial images were obtained from the base of the skull through the vertex without intravenous contrast. COMPARISON:  05/07/2015 FINDINGS: Bony calvarium is intact. No gross soft tissue abnormality is noted. No findings to suggest acute hemorrhage, acute infarction or space-occupying mass lesion are noted. IMPRESSION: No acute abnormality noted. Electronically Signed   By: Alcide CleverMark  Lukens M.D.   On: 05/11/2015 14:42   Dg Chest Port 1 View  05/11/2015  CLINICAL DATA:  Respiratory failure, drug overdose. , intubated patient. EXAM: PORTABLE CHEST 1 VIEW COMPARISON:  Portable chest x-ray of May 10, 2015 FINDINGS: The lungs are adequately inflated. There are persistent increased airspace opacities in the mid and lower lungs. The heart is top-normal in size. There is partial obscuration of the left hemidiaphragm. The pulmonary vascularity is less engorged today. The endotracheal tube tip projects 2.7 cm above the carina. The right internal jugular venous catheter tip projects at the cavoatrial junction. The esophagogastric tube tip projects below the inferior margin of the image. IMPRESSION: Fairly stable appearance of the chest with bilateral airspace opacities compatible with pneumonia. Low-grade pulmonary interstitial edema is present. A pleural effusion on the left is suspected. The support tubes are in reasonable position. Correlation as to the appropriateness of the positioning of the right internal jugular venous catheter at the cavoatrial junction is needed. Electronically Signed   By: David  SwazilandJordan M.D.   On: 05/11/2015 07:36   Portable Chest Xray  05/10/2015  CLINICAL DATA:  Intubation. EXAM: PORTABLE CHEST 1 VIEW COMPARISON:  05/09/2015. FINDINGS: Endotracheal tube, right IJ line in stable position. Heart size stable. Slight interim clearing of diffuse bilateral pulmonary infiltrates. Small left pleural effusion cannot be excluded. No pneumothorax. IMPRESSION: 1. Lines and tubes in  stable position. 2. Persistent diffuse bilateral pulmonary infiltrates with slight interim clearing from prior exam . Small left pleural effusion cannot be excluded. Electronically Signed   By: Maisie Fushomas  Register   On: 05/10/2015 07:32     Assessment/Plan:   39 yo RHD F [facility] after taking like 90 pills of 100mg  Wellbutrin Pt failed extubation 10/10 due to pulmonary edema.  [facility] showing any acute intracranial abnormality EEG 10/10 no evidence of seizures but diffuse spindle coma which is seen in severe encephalopathy. Moves all extremities but no purpose to movements.  She is being taken off propofol and being started on precedex.    CTH reviewed no acute changes in comparison to the one from 10/8.   - Would con't precedex in hopes of extubation once respiratory function/CXR as well as mental status improves - Would consider baseline anti psychotic such as seroquel 25-50mg  BID to try to decrease sedation down.   - Con't keppra at 1gm - Repeat EEG if mental status does not con't to improve.      05/11/2015, 8:29 PM

## 2015-05-11 NOTE — Progress Notes (Signed)
Notified MD on call (E-Link) about patient's continued tachypnea (RR in 50s), agitation, and fluctuating O2 sats (80s and sometimes low 90s). RN had already given prn ativan and prn fentanyl every time available and precedex at max dose. Respiratory had adjusted settings earlier but patient at 100% FiO2, 10 PEEP, and in pressure control. MD ordered RN to restart propofol that had been weaned off earlier in shift to calm patient, lower RR, and hopeful improve and stabilize O2 sats.

## 2015-05-11 NOTE — Progress Notes (Signed)
Pt profile: [age] F with multiple previous suicide attempts took large OD of bupropion 10/07 c/b acute resp failure, protracted seizures, shock. Improved 10/10 and able to F/C intermittently. Failed extubation 10/10 with pulm edema pattern on CXR after re-intubation  Active problems: VDRF Pulm edema Possible aspiration Failed extubation 10/10 Cardiomyopathy (LVEF 40%) Shock, resolved Hypokalemia Acute encephalopathy Seizures due to bupropion OD   Lines, Tubes, etc: ETT 10/07 >> 10/10, 10/10 >>  R IJ CVL 10/09 >>  R femoral A-line 10/09 >>   Microbiology: MRSA PCR 10/07 >> NEG Urine 10/08 >> NEG Resp 10/10 >>  Blood 10/08 >>  PCT 10/11 >>   Antibiotics:  Vanc 10/09 >> 10/10 Pip-tazo 10/09 >> 10/10  Studies/Events: 10/08 CT head: normal 10/09 TTE: LVEF 40-45% 10/10 Failed extubation. 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  Consults:  PCCM 10/08 Neuro 10/08   Best Practice: DVT: LMWH SUP: PPI Nutrition: TFs 10/11 Glycemic control: no SSI indicated Sedation/analgesia: Propofol  Subj: RASS --2 to +1, + F/C intermittent  Obj: Filed Vitals:   05/11/15 1800  BP: 117/83  Pulse: 93  Temp:   Resp: 28    Gen: WDWN, RASS -2, intubated HEENT: NCAT, EOMI, PERRL Neck: No JVD Chest: diffuse rhonchi Cardiac: reg, no M Abd: soft, NT Ext: warm, no edema Neuro: MAEs vigorously to stimulation  BMET BMP Latest Ref Rng 05/11/2015 05/10/2015 05/10/2015  Glucose 65 - 99 mg/dL 87 73 73  BUN 6 - 20 mg/dL 15 15 14   Creatinine 0.44 - 1.00 mg/dL 1.610.88 0.960.65 0.450.69  Sodium 135 - 145 mmol/L 146(H) 141 142  Potassium 3.5 - 5.1 mmol/L 3.2(L) 3.4(L) 3.4(L)  Chloride 101 - 111 mmol/L 113(H) 111 110  CO2 22 - 32 mmol/L 27 29 28   Calcium 8.9 - 10.3 mg/dL 7.8(L) 7.2(L) 7.2(L)     CBC CBC Latest Ref Rng 05/11/2015 05/10/2015 05/09/2015  WBC 3.6 - 11.0 K/uL 12.8(H) 9.7 12.7(H)   Hemoglobin 12.0 - 16.0 g/dL 4.0(J8.4(L) 8.1(X9.0(L) 10.0(L)  Hematocrit 35.0 - 47.0 % 26.2(L) 26.8(L) 30.3(L)  Platelets 150 - 440 K/uL 147(L) 142(L) 139(L)      CXR: edema vs ALI pattern   IMPRESSION: VDRF Failed extubation 10/10 pulm edema Possible aspiration Cardiomyopathy - likely toxic Hypokalemia Rhabdomyolysis Lactic acidosis, resolved Elevated LFTs Severe TME, Likely toxic, possible hepatic in origin  PLAN/RECS:  Cont full vent support - settings reviewed and/or adjusted Cont vent bundle Daily SBT if/when meets criteria MAP goal > 60 mmHg Monitor BMET intermittently Monitor I/Os Correct electrolytes as indicated Cont SUP and DVT px Consider TFs 10/11 Cont anticonvulsants per Neuro Discussed with Dr Hilton SinclairWeiting  CCM time: 35 mins The above time includes time spent in consultation with patient and/or family members and reviewing care plan on multidisciplinary rounds  Billy Fischeravid Helina Hullum, MD PCCM service Mobile [phone] Pager [phone]

## 2015-05-11 NOTE — Progress Notes (Signed)
Spoke to Dr. Sung AmabileSimonds about patient's tachypnea while on max dose of precedex and after prn ativan (for anxiety dose not seizure dose) and prn fentanyl. MD stated he would be by to check on patient.

## 2015-05-11 NOTE — Progress Notes (Signed)
Patient ID: Noni SaupeRobin Chapa, female   DOB: December 28, 1975, 39 y.o.   MRN: [id number] [facility] PRO[facility]ESS [facility]  PCP: No PCP Per Patient  HPI/Subjective: Patient to On Precedex sedation. As per nursing staff, she does get agitated when taken off sedation and is not following commands. She did close her eyes when I flash to light and there. Was able to move her extremities on her own but not to command.  Objective: Filed Vitals:   05/11/15 1258  BP:   Pulse:   Temp: 99.9 F (37.7 C)  Resp:     Intake/Output Summary (Last 24 hours) at 05/11/15 1419 Last data filed at 05/11/15 1235  Gross per 24 hour  Intake 736.26 ml  Output   1825 ml  Net -1088.74 ml   Filed Weights   05/05/2015 1756 05/11/15 0559  Weight: 74.526 kg (164 lb 4.8 oz) 75.3 kg (166 lb 0.1 oz)    ROS: Review of Systems  Unable to perform ROS  secondary to being on the ventilator and on sedation Exam: Physical Exam  Constitutional: She appears lethargic.  HENT:  Nose: No mucosal edema.  Unable to look in mouth.  Eyes: Conjunctivae and lids are normal. Pupils are equal, round, and reactive to light.  Neck: No JVD present. Carotid bruit is not present. No edema present. No thyroid mass and no thyromegaly present.  Cardiovascular: S1 normal and S2 normal.  Exam reveals no gallop.   No murmur heard. Pulses:      Dorsalis pedis pulses are 2+ on the right side, and 2+ on the left side.  Respiratory: No respiratory distress. She has no wheezes. She has rhonchi in the right lower field and the left lower field. She has no rales.  GI: Soft. Bowel sounds are normal. There is no tenderness.  Musculoskeletal:       Right ankle: She exhibits swelling.       Left ankle: She exhibits swelling.  Lymphadenopathy:    She has no cervical adenopathy.  Neurological: She appears lethargic.  Seen moving extremities on her own but not to purpose. Close her eyelids when I shine the light in her eyes.  Skin: Skin is warm. No  rash noted. Nails show no clubbing.  Psychiatric:  Agitated when taken off sedation.    Data Reviewed: Basic Metabolic Panel:  Recent Labs Lab 05/05/15 2100  05/08/15 0508 05/08/15 1602 05/09/15 0917 05/10/15 0510 05/10/15 1024 05/11/15 0500  NA  --   < >  --  146* 143  144 142 141 146*  K  --   < >  --  2.6* 3.1*  3.2* 3.4* 3.4* 3.2*  CL  --   < >  --  105 110  110 110 111 113*  CO2  --   < >  --  32 30  31 28 29 27   GLUCOSE  --   < >  --  135* 122*  121* 73 73 87  BUN  --   < >  --  16 16  16 14 15 15   CREATININE  --   < >  --  1.35* 0.79  0.78 0.69 0.65 0.88  CALCIUM  --   < >  --  6.7* 6.6*  6.8* 7.2* 7.2* 7.8*  MG 2.0  --  2.0  --   --   --   --   --   < > = values in this interval not displayed. Liver Function Tests:  Recent Labs Lab 05/08/15 1602 05/09/15 0917 05/10/15 0510 05/10/15 1024 05/11/15 0500  AST 994*  1025* 833* 699* 764* 1247*  ALT 1044*  1093* 991* 999* 1099* 1895*  ALKPHOS 52  54 52 52 63 82  BILITOT 1.9*  2.2* 1.2 0.5 1.0 1.1  PROT 5.2*  5.3* 4.7* 4.8* 5.2* 5.4*  ALBUMIN 3.1*  3.0* 2.6* 2.7* 2.6* 2.5*    Recent Labs Lab 06/01/15 1744  LIPASE 25    Recent Labs Lab 05/09/15 1741  AMMONIA 34   CBC:  Recent Labs Lab 01-Jun-2015 1744  05/08/15 0508 05/08/15 1602 05/09/15 0917 05/10/15 0510 05/11/15 0500  WBC 10.4  < > 19.5* 23.7* 12.7* 9.7 12.8*  NEUTROABS 6.7*  --  17.6*  --   --   --   --   HGB 14.2  < > 10.7* 12.0 10.0* 9.0* 8.4*  HCT 43.3  < > 32.9* 36.2 30.3* 26.8* 26.2*  MCV 87.1  < > 88.4 85.8 88.2 89.2 89.0  PLT 298  < > 212 258 139* 142* 147*  < > = values in this interval not displayed. Cardiac Enzymes:  Recent Labs Lab 05/08/15 1602 05/09/15 0917 05/09/15 1741 05/10/15 0510 05/11/15 0500  CKTOTAL 4378* 40981* 15266* 14052* 5905*   BNP (last 3 results)  Recent Labs  05/11/15 0500  BNP 83.0    CBG:  Recent Labs Lab 05/10/15 1604 05/10/15 1934 05/10/15 2358 05/11/15 0726  05/11/15 1233  GLUCAP 82 81 86 97 71    Recent Results (from the past 240 hour(s))  MRSA PCR Screening     Status: None   Collection Time: 01-Jun-2015 11:05 PM  Result Value Ref Range Status   MRSA by PCR NEGATIVE NEGATIVE Final    Comment:        The GeneXpert MRSA Assay (FDA approved for NASAL specimens only), is one component of a comprehensive MRSA colonization surveillance program. It is not intended to diagnose MRSA infection nor to guide or monitor treatment for MRSA infections.   Urine culture     Status: None   Collection Time: 05/07/15  8:32 AM  Result Value Ref Range Status   Specimen Description URINE, CATHETERIZED  Final   Special Requests Normal  Final   Culture NO GROWTH 2 DAYS  Final   Report Status 05/09/2015 FINAL  Final  Culture, blood (routine x 2)     Status: None (Preliminary result)   Collection Time: 05/07/15  8:43 AM  Result Value Ref Range Status   Specimen Description BLOOD RIGHT AC  Final   Special Requests BOTTLES DRAWN AEROBIC AND ANAEROBIC  7CC  Final   Culture NO GROWTH 4 DAYS  Final   Report Status PENDING  Incomplete  Culture, blood (routine x 2)     Status: None (Preliminary result)   Collection Time: 05/07/15  8:55 AM  Result Value Ref Range Status   Specimen Description BLOOD RIGHT HAND  Final   Special Requests   Final    BOTTLES DRAWN AEROBIC AND ANAEROBIC  AER 7CC ANA 10CC   Culture NO GROWTH 4 DAYS  Final   Report Status PENDING  Incomplete  Culture, respiratory (NON-Expectorated)     Status: None   Collection Time: 05/09/15  3:47 PM  Result Value Ref Range Status   Specimen Description TRACHEAL ASPIRATE  Final   Special Requests NONE  Final   Gram Stain   Final    EXCELLENT SPECIMEN - 90-100% WBCS MODERATE WBC SEEN RARE GRAM  POSITIVE COCCI    Culture APPEARS TO BE NORMAL RESPIRATORY FLORA  Final   Report Status 05/11/2015 FINAL  Final     Studies: Dg Abd 1 View  05/09/2015  CLINICAL DATA:  Orogastric tube placement.  EXAM: ABDOMEN - 1 VIEW COMPARISON:  May 07, 2015. FINDINGS: The bowel gas pattern is normal. Distal tip of orogastric tube is seen in distal stomach. No radio-opaque calculi or other significant radiographic abnormality are seen. IMPRESSION: Distal tip of orogastric tube is seen in distal stomach. No evidence of bowel obstruction or ileus is noted. Electronically Signed   By: Lupita Raider, M.D.   On: 05/09/2015 16:06   Dg Chest Port 1 View  05/11/2015  CLINICAL DATA:  Respiratory failure, drug overdose. , intubated patient. EXAM: PORTABLE CHEST 1 VIEW COMPARISON:  Portable chest x-ray of May 10, 2015 FINDINGS: The lungs are adequately inflated. There are persistent increased airspace opacities in the mid and lower lungs. The heart is top-normal in size. There is partial obscuration of the left hemidiaphragm. The pulmonary vascularity is less engorged today. The endotracheal tube tip projects 2.7 cm above the carina. The right internal jugular venous catheter tip projects at the cavoatrial junction. The esophagogastric tube tip projects below the inferior margin of the image. IMPRESSION: Fairly stable appearance of the chest with bilateral airspace opacities compatible with pneumonia. Low-grade pulmonary interstitial edema is present. A pleural effusion on the left is suspected. The support tubes are in reasonable position. Correlation as to the appropriateness of the positioning of the right internal jugular venous catheter at the cavoatrial junction is needed. Electronically Signed   By: David  [place] M.D.   On: 05/11/2015 07:36   Portable Chest Xray  05/10/2015  CLINICAL DATA:  Intubation. EXAM: PORTABLE CHEST 1 VIEW COMPARISON:  05/09/2015. FINDINGS: Endotracheal tube, right IJ line in stable position. Heart size stable. Slight interim clearing of diffuse bilateral pulmonary infiltrates. Small left pleural effusion cannot be excluded. No pneumothorax. IMPRESSION: 1. Lines and tubes in stable  position. 2. Persistent diffuse bilateral pulmonary infiltrates with slight interim clearing from prior exam . Small left pleural effusion cannot be excluded. Electronically Signed   By: Maisie Fus  Register   On: 05/10/2015 07:32   Portable Chest Xray  05/09/2015  CLINICAL DATA:  Post intubation EXAM: PORTABLE CHEST 1 VIEW COMPARISON:  05/08/2015 FINDINGS: Endotracheal tube is 3 cm above the carina. Right central line tip near the cavoatrial junction. NG tube is in the stomach. Worsening bilateral perihilar and lower lobe opacities, presumably edema. Suspect layering effusions. Heart is upper limits normal in size. IMPRESSION: Endotracheal tube 3 cm above the carina. New moderate bilateral airspace opacities in the perihilar and lower lobe regions concerning for edema. Electronically Signed   By: Charlett Nose M.D.   On: 05/09/2015 16:05    Scheduled Meds: . antiseptic oral rinse  7 mL Mouth Rinse QID  . cefTAZidime (FORTAZ)  IV  2 g Intravenous 3 times per day  . chlorhexidine gluconate  15 mL Mouth Rinse BID  . enoxaparin (LOVENOX) injection  40 mg Subcutaneous Q24H  . feeding supplement (PRO-STAT SUGAR FREE 64)  30 mL Oral BID  . free water  200 mL Per Tube 3 times per day  . lactulose  30 g Per Tube BID  . levETIRAcetam  1,000 mg Intravenous Q12H  . pantoprazole (PROTONIX) IV  40 mg Intravenous Daily  . potassium chloride  40 mEq Per Tube BID  . vancomycin  1,250 mg  Intravenous Q12H   Continuous Infusions: . sodium chloride 100 mL/hr at 05/11/15 1212  . dexmedetomidine 1.2 mcg/kg/hr (05/11/15 1316)  . feeding supplement (VITAL 1.5 CAL) 1,000 mL (05/11/15 1300)  . propofol (DIPRIVAN) infusion Stopped (05/11/15 1237)    Assessment/Plan:  1. 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 Metabolic encephalopathy. Mental status will be the key on her prognosis. Patient is on Keppra and neurology is following. Last EEG showed diffuse slowing but no active seizure. Repeat CT scan of the head  ordered by neurology. 2. Ventricular tachycardia cardiac arrest and lactic acidosis 3. Aspiration pneumonia- patient placed on vancomycin and ceftazidime 4. Acute respiratory failure with hypoxia- patient required reintubation. Full vent support at this time until mental status improves. 5. Hypokalemia- electrolyte protocol 6. Anemia- hemoglobin now trending down to 8.4. 7.   Rhabdomyolysis- patient's CPK is trending better. Likely secondary to status epilepticus. On IV fluid hydration 8.   Shock liver- liver enzymes still elevated. PT slightly high on 05/08/2015.  Code Status:     Code Status Orders        Start     Ordered   05/12/2015 2311  Full code   Continuous     05/05/2015 2310     Family Communication: Friend at the bedside Disposition Plan: To be determined  Consultants:  Critical care specialist  Neurology  Antibiotics:  Vancomycin  Zosyn  Time spent: 32 minutes, patient is critically ill and high risk for cardiopulmonary arrest. Overall prognosis is going to depend on mental status.  Alford Highland  [facility]

## 2015-05-11 NOTE — Therapy (Signed)
Called to patient's room by nurse due to desaturation. Patient found with SpO2 85% on FiO2 .80. Suctioned small amount of thick yellow/red secretions with no effect on SpO2. FiO2 titrated up to .95 to maintain SpO2 greater than 90%.

## 2015-05-12 ENCOUNTER — Inpatient Hospital Stay: Payer: Medicaid Other

## 2015-05-12 DIAGNOSIS — R7989 Other specified abnormal findings of blood chemistry: Secondary | ICD-10-CM

## 2015-05-12 DIAGNOSIS — B449 Aspergillosis, unspecified: Secondary | ICD-10-CM

## 2015-05-12 DIAGNOSIS — J80 Acute respiratory distress syndrome: Secondary | ICD-10-CM

## 2015-05-12 DIAGNOSIS — R748 Abnormal levels of other serum enzymes: Secondary | ICD-10-CM

## 2015-05-12 LAB — BLOOD GAS, ARTERIAL
Acid-Base Excess: 1.9 mmol/L (ref 0.0–3.0)
BICARBONATE: 28.2 meq/L — AB (ref 21.0–28.0)
FIO2: 1
Mechanical Rate: 24
O2 SAT: 98.6 %
PATIENT TEMPERATURE: 37
PCO2 ART: 50 mmHg — AB (ref 32.0–48.0)
PEEP/CPAP: 12 cmH2O
PO2 ART: 122 mmHg — AB (ref 83.0–108.0)
PRESSURE CONTROL: 18 cmH2O
pH, Arterial: 7.36 (ref 7.350–7.450)

## 2015-05-12 LAB — CBC
HEMATOCRIT: 25.6 % — AB (ref 35.0–47.0)
Hemoglobin: 8.3 g/dL — ABNORMAL LOW (ref 12.0–16.0)
MCH: 29.3 pg (ref 26.0–34.0)
MCHC: 32.5 g/dL (ref 32.0–36.0)
MCV: 90 fL (ref 80.0–100.0)
PLATELETS: 140 10*3/uL — AB (ref 150–440)
RBC: 2.84 MIL/uL — ABNORMAL LOW (ref 3.80–5.20)
RDW: 14.4 % (ref 11.5–14.5)
WBC: 17 10*3/uL — ABNORMAL HIGH (ref 3.6–11.0)

## 2015-05-12 LAB — CULTURE, BLOOD (ROUTINE X 2)
CULTURE: NO GROWTH
Culture: NO GROWTH

## 2015-05-12 LAB — COMPREHENSIVE METABOLIC PANEL
ALT: 1760 U/L — ABNORMAL HIGH (ref 14–54)
AST: 649 U/L — ABNORMAL HIGH (ref 15–41)
Albumin: 2.3 g/dL — ABNORMAL LOW (ref 3.5–5.0)
Alkaline Phosphatase: 102 U/L (ref 38–126)
Anion gap: 5 (ref 5–15)
BILIRUBIN TOTAL: 0.6 mg/dL (ref 0.3–1.2)
BUN: 17 mg/dL (ref 6–20)
CHLORIDE: 117 mmol/L — AB (ref 101–111)
CO2: 25 mmol/L (ref 22–32)
CREATININE: 0.75 mg/dL (ref 0.44–1.00)
Calcium: 7.8 mg/dL — ABNORMAL LOW (ref 8.9–10.3)
GFR calc Af Amer: >60 mL/min (ref >60)
Glucose, Bld: 106 mg/dL — ABNORMAL HIGH (ref 65–99)
POTASSIUM: 3.6 mmol/L (ref 3.5–5.1)
Sodium: 147 mmol/L — ABNORMAL HIGH (ref 135–145)
TOTAL PROTEIN: 5.6 g/dL — AB (ref 6.5–8.1)

## 2015-05-12 LAB — GLUCOSE, CAPILLARY
GLUCOSE-CAPILLARY: 116 mg/dL — AB (ref 65–99)
GLUCOSE-CAPILLARY: 118 mg/dL — AB (ref 65–99)
Glucose-Capillary: 101 mg/dL — ABNORMAL HIGH (ref 65–99)
Glucose-Capillary: 111 mg/dL — ABNORMAL HIGH (ref 65–99)
Glucose-Capillary: 106 mg/dL — ABNORMAL HIGH (ref 65–99)
Glucose-Capillary: 116 mg/dL — ABNORMAL HIGH (ref 65–99)

## 2015-05-12 LAB — PROCALCITONIN: PROCALCITONIN: 1.98 ng/mL

## 2015-05-12 LAB — VANCOMYCIN, TROUGH: VANCOMYCIN TR: 11 ug/mL (ref 10–20)

## 2015-05-12 MED ORDER — ACETAMINOPHEN 160 MG/5ML PO SOLN
650.0000 mg | ORAL | Status: DC | PRN
  Administered 2015-05-12 (×2): 650 mg via ORAL
  Filled 2015-05-12 (×2): qty 20.3

## 2015-05-12 MED ORDER — FREE WATER
200.0000 mL | Status: DC
  Administered 2015-05-12 – 2015-05-17 (×31): 200 mL

## 2015-05-12 MED ORDER — FENTANYL 2500MCG IN NS 250ML (10MCG/ML) PREMIX INFUSION
25.0000 ug/h | INTRAVENOUS | Status: DC
  Administered 2015-05-12: 250 ug/h via INTRAVENOUS
  Administered 2015-05-12: 150 ug/h via INTRAVENOUS
  Administered 2015-05-13 (×2): 200 ug/h via INTRAVENOUS
  Administered 2015-05-14 (×2): 225 ug/h via INTRAVENOUS
  Administered 2015-05-15: 30 ug/h via INTRAVENOUS
  Administered 2015-05-15: 200 ug/h via INTRAVENOUS
  Administered 2015-05-15 – 2015-05-17 (×5): 300 ug/h via INTRAVENOUS
  Administered 2015-05-18 (×2): 400 ug/h via INTRAVENOUS
  Administered 2015-05-18: 300 ug/h via INTRAVENOUS
  Administered 2015-05-18: 350 ug/h via INTRAVENOUS
  Administered 2015-05-19 (×2): 400 ug/h via INTRAVENOUS
  Filled 2015-05-12 (×20): qty 250

## 2015-05-12 MED ORDER — FUROSEMIDE 10 MG/ML IJ SOLN
40.0000 mg | Freq: Once | INTRAMUSCULAR | Status: AC
  Administered 2015-05-12: 40 mg via INTRAVENOUS
  Filled 2015-05-12: qty 4

## 2015-05-12 MED ORDER — POTASSIUM CHLORIDE 20 MEQ/15ML (10%) PO SOLN
40.0000 meq | Freq: Two times a day (BID) | ORAL | Status: AC
  Administered 2015-05-12 (×2): 40 meq
  Filled 2015-05-12 (×3): qty 30

## 2015-05-12 MED ORDER — PANTOPRAZOLE SODIUM 40 MG PO PACK
40.0000 mg | PACK | Freq: Every day | ORAL | Status: DC
  Administered 2015-05-13 – 2015-05-18 (×6): 40 mg
  Filled 2015-05-12 (×6): qty 20

## 2015-05-12 MED ORDER — IBUPROFEN 100 MG/5ML PO SUSP
400.0000 mg | Freq: Four times a day (QID) | ORAL | Status: DC | PRN
  Administered 2015-05-12: 400 mg
  Filled 2015-05-12 (×2): qty 20

## 2015-05-12 MED ORDER — FUROSEMIDE 10 MG/ML IJ SOLN: 40.0000 mg | Freq: Once | INTRAMUSCULAR | Status: DC

## 2015-05-12 MED ORDER — FENTANYL CITRATE (PF) 100 MCG/2ML IJ SOLN
25.0000 ug | INTRAMUSCULAR | Status: DC | PRN
  Administered 2015-05-12: 100 ug via INTRAVENOUS
  Administered 2015-05-12: 50 ug via INTRAVENOUS
  Administered 2015-05-12: 100 ug via INTRAVENOUS
  Administered 2015-05-13: 50 ug via INTRAVENOUS
  Administered 2015-05-13 – 2015-05-16 (×3): 100 ug via INTRAVENOUS
  Filled 2015-05-12 (×7): qty 2

## 2015-05-12 MED ORDER — ACETAMINOPHEN 160 MG/5ML PO SOLN
650.0000 mg | ORAL | Status: DC | PRN
  Administered 2015-05-14: 650 mg
  Filled 2015-05-12 (×3): qty 20.3

## 2015-05-12 MED ORDER — FUROSEMIDE 10 MG/ML IJ SOLN
80.0000 mg | Freq: Once | INTRAMUSCULAR | Status: AC
Start: 2015-05-12 — End: 2015-05-12
  Administered 2015-05-12: 80 mg via INTRAVENOUS
  Filled 2015-05-12: qty 8

## 2015-05-12 NOTE — Progress Notes (Signed)
eLink Physician-Brief Progress Note Patient Name: Noni SaupeRobin Willison DOB: 1976/03/02 MRN: [id number]   Date of Service  05/12/2015  HPI/Events of Note  Worsening sats on 10 peep/ sputum pink  eICU Interventions  Lasix 80 mg IV/ check cvp/ increase vent to 24 to completely capture unless air trapping an issue And recheck abgs in 2 h        Sandrea HughsMichael Cesar Alf 05/12/2015, 6:19 PM

## 2015-05-12 NOTE — Progress Notes (Signed)
RN notified Dr. Sung AmabileSimonds that patient is tachypnic with RR mid to upper 30's, o2 sats mid to upper 90's on 70% fio2 and that patient has a fever 103.2 that was treated at 0830 with tylenol and it has not decreased. Dr. Sung AmabileSimonds assessed patient at bedside and turned propofol down and increased precedex and instructed RN to use fentanyl if needed and stated he would order ibuprofen for fever. RN monitoring patient.

## 2015-05-12 NOTE — Progress Notes (Signed)
Patient ID: Kathleen SaupeRobin Gray, female   DOB: January 21, 1976, 39 y.o.   MRN: [id number] Patient ID: Kathleen Gray, female   DOB: January 21, 1976, 39 y.o.   MRN: [id number] [facility] PROGRESS NOTE  PCP: No PCP Per Patient  HPI/Subjective: Patient to On Precedex sedation. As per nursing staff, she does get agitated when taken off sedation and is not following commands. She did close her eyes when I flash to light and there. Was able to move her extremities on her own but not to command.  Objective: Filed Vitals:   05/12/15 1400  BP: 128/85  Pulse: 105  Temp:   Resp: 24    Intake/Output Summary (Last 24 hours) at 05/12/15 1437 Last data filed at 05/12/15 1414  Gross per 24 hour  Intake 5036.18 ml  Output   3015 ml  Net 2021.18 ml   Filed Weights   29-Sep-2014 1756 05/11/15 0559 05/12/15 0434  Weight: 74.526 kg (164 lb 4.8 oz) 75.3 kg (166 lb 0.1 oz) 74.5 kg (164 lb 3.9 oz)    ROS: Review of Systems  Unable to perform ROS  secondary to being on the ventilator and on sedation Exam: Physical Exam  Constitutional: She appears lethargic.  HENT:  Nose: No mucosal edema.  Unable to look in mouth.  Eyes: Conjunctivae and lids are normal. Pupils are equal, round, and reactive to light.  Neck: No JVD present. Carotid bruit is not present. No edema present. No thyroid mass and no thyromegaly present.  Cardiovascular: S1 normal and S2 normal.  Exam reveals no gallop.   No murmur heard. Pulses:      Dorsalis pedis pulses are 2+ on the right side, and 2+ on the left side.  Respiratory: No respiratory distress. She has no wheezes. She has no rhonchi. She has rales in the right lower field and the left lower field.  GI: Soft. Bowel sounds are normal. There is no tenderness.  Musculoskeletal:       Right ankle: She exhibits swelling.       Left ankle: She exhibits swelling.  Lymphadenopathy:    She has no cervical adenopathy.  Neurological: She appears lethargic.  Seen moving extremities on  her own but not to purpose.   Skin: Skin is warm. No rash noted. Nails show no clubbing.  Psychiatric:  Agitated when taken off sedation. As per the son, she did mouth the words I love you to him. As per the nurse, did follow commands with decreased sedation.    Data Reviewed: Basic Metabolic Panel:  Recent Labs Lab 05/05/15 2100  05/08/15 0508  05/09/15 0917 05/10/15 0510 05/10/15 1024 05/11/15 0500 05/12/15 0359  NA  --   < >  --   < > 143  144 142 141 146* 147*  K  --   < >  --   < > 3.1*  3.2* 3.4* 3.4* 3.2* 3.6  CL  --   < >  --   < > 110  110 110 111 113* 117*  CO2  --   < >  --   < > 30  31 28 29 27 25   GLUCOSE  --   < >  --   < > 122*  121* 73 73 87 106*  BUN  --   < >  --   < > 16  16 14 15 15 17   CREATININE  --   < >  --   < > 0.79  0.78 0.69 0.65  0.88 0.75  CALCIUM  --   < >  --   < > 6.6*  6.8* 7.2* 7.2* 7.8* 7.8*  MG 2.0  --  2.0  --   --   --   --   --   --   < > = values in this interval not displayed. Liver Function Tests:  Recent Labs Lab 05/09/15 0917 05/10/15 0510 05/10/15 1024 05/11/15 0500 05/12/15 0359  AST 833* 699* 764* 1247* 649*  ALT 991* 999* 1099* 1895* 1760*  ALKPHOS 52 52 63 82 102  BILITOT 1.2 0.5 1.0 1.1 0.6  PROT 4.7* 4.8* 5.2* 5.4* 5.6*  ALBUMIN 2.6* 2.7* 2.6* 2.5* 2.3*    Recent Labs Lab 05/24/2015 1744  LIPASE 25    Recent Labs Lab 05/09/15 1741  AMMONIA 34   CBC:  Recent Labs Lab 05/14/2015 1744  05/08/15 0508 05/08/15 1602 05/09/15 0917 05/10/15 0510 05/11/15 0500 05/12/15 0359  WBC 10.4  < > 19.5* 23.7* 12.7* 9.7 12.8* 17.0*  NEUTROABS 6.7*  --  17.6*  --   --   --   --   --   HGB 14.2  < > 10.7* 12.0 10.0* 9.0* 8.4* 8.3*  HCT 43.3  < > 32.9* 36.2 30.3* 26.8* 26.2* 25.6*  MCV 87.1  < > 88.4 85.8 88.2 89.2 89.0 90.0  PLT 298  < > 212 258 139* 142* 147* 140*  < > = values in this interval not displayed. Cardiac Enzymes:  Recent Labs Lab 05/08/15 1602 05/09/15 0917 05/09/15 1741 05/10/15 0510  05/11/15 0500  CKTOTAL 4378* 11685* 15266* 14052* 5905*   BNP (last 3 results)  Recent Labs  05/11/15 0500  BNP 83.0    CBG:  Recent Labs Lab 05/11/15 1950 05/12/15 0001 05/12/15 0358 05/12/15 0716 05/12/15 1159  GLUCAP 101* 116* 101* 118* 111*    Recent Results (from the past 240 hour(s))  MRSA PCR Screening     Status: None   Collection Time: 05/21/2015 11:05 PM  Result Value Ref Range Status   MRSA by PCR NEGATIVE NEGATIVE Final    Comment:        The GeneXpert MRSA Assay (FDA approved for NASAL specimens only), is one component of a comprehensive MRSA colonization surveillance program. It is not intended to diagnose MRSA infection nor to guide or monitor treatment for MRSA infections.   Urine culture     Status: None   Collection Time: 05/07/15  8:32 AM  Result Value Ref Range Status   Specimen Description URINE, CATHETERIZED  Final   Special Requests Normal  Final   Culture NO GROWTH 2 DAYS  Final   Report Status 05/09/2015 FINAL  Final  Culture, blood (routine x 2)     Status: None   Collection Time: 05/07/15  8:43 AM  Result Value Ref Range Status   Specimen Description BLOOD RIGHT AC  Final   Special Requests BOTTLES DRAWN AEROBIC AND ANAEROBIC  7CC  Final   Culture NO GROWTH 5 DAYS  Final   Report Status 05/12/2015 FINAL  Final  Culture, blood (routine x 2)     Status: None   Collection Time: 05/07/15  8:55 AM  Result Value Ref Range Status   Specimen Description BLOOD RIGHT HAND  Final   Special Requests   Final    BOTTLES DRAWN AEROBIC AND ANAEROBIC  AER 7CC ANA 10CC   Culture NO GROWTH 5 DAYS  Final   Report Status 05/12/2015 FINAL  Final  Culture, respiratory (NON-Expectorated)     Status: None   Collection Time: 05/09/15  3:47 PM  Result Value Ref Range Status   Specimen Description TRACHEAL ASPIRATE  Final   Special Requests NONE  Final   Gram Stain   Final    EXCELLENT SPECIMEN - 90-100% WBCS MODERATE WBC SEEN RARE GRAM POSITIVE  COCCI    Culture APPEARS TO BE NORMAL RESPIRATORY FLORA  Final   Report Status 05/11/2015 FINAL  Final     Studies: Ct Head Wo Contrast  05/11/2015  CLINICAL DATA:  Recent overdose on Wellbutrin, anoxic brain injury EXAM: CT HEAD WITHOUT CONTRAST TECHNIQUE: Contiguous axial images were obtained from the base of the skull through the vertex without intravenous contrast. COMPARISON:  05/07/2015 FINDINGS: Bony calvarium is intact. No gross soft tissue abnormality is noted. No findings to suggest acute hemorrhage, acute infarction or space-occupying mass lesion are noted. IMPRESSION: No acute abnormality noted. Electronically Signed   By: Alcide Clever M.D.   On: 05/11/2015 14:42   Dg Chest Port 1 View  05/12/2015  CLINICAL DATA:  Followup respiratory failure.  Drug overdose. EXAM: PORTABLE CHEST 1 VIEW COMPARISON:  05/11/2015 FINDINGS: Endotracheal tube tip at the clavicular heads. Orogastric tube reaches the stomach at least. Right IJ central line, tip at the upper right atrium and stable. Partial clearing of bilateral dense airspace disease at the bases. Stable heart size. Probable layering left pleural effusion. IMPRESSION: 1. Stable positioning of tubes and central line. 2. Slight improvement in diffuse airspace disease which could reflect noncardiogenic edema, pneumonia, or aspiration. Electronically Signed   By: Marnee Spring M.D.   On: 05/12/2015 07:49   Dg Chest Port 1 View  05/11/2015  CLINICAL DATA:  Respiratory failure, drug overdose. , intubated patient. EXAM: PORTABLE CHEST 1 VIEW COMPARISON:  Portable chest x-ray of May 10, 2015 FINDINGS: The lungs are adequately inflated. There are persistent increased airspace opacities in the mid and lower lungs. The heart is top-normal in size. There is partial obscuration of the left hemidiaphragm. The pulmonary vascularity is less engorged today. The endotracheal tube tip projects 2.7 cm above the carina. The right internal jugular venous  catheter tip projects at the cavoatrial junction. The esophagogastric tube tip projects below the inferior margin of the image. IMPRESSION: Fairly stable appearance of the chest with bilateral airspace opacities compatible with pneumonia. Low-grade pulmonary interstitial edema is present. A pleural effusion on the left is suspected. The support tubes are in reasonable position. Correlation as to the appropriateness of the positioning of the right internal jugular venous catheter at the cavoatrial junction is needed. Electronically Signed   By: David  [place] M.D.   On: 05/11/2015 07:36    Scheduled Meds: . antiseptic oral rinse  7 mL Mouth Rinse QID  . cefTAZidime (FORTAZ)  IV  2 g Intravenous 3 times per day  . chlorhexidine gluconate  15 mL Mouth Rinse BID  . enoxaparin (LOVENOX) injection  40 mg Subcutaneous Q24H  . feeding supplement (PRO-STAT SUGAR FREE 64)  30 mL Oral BID  . free water  200 mL Per Tube Q4H  . lactulose  30 g Per Tube BID  . levETIRAcetam  1,000 mg Intravenous Q12H  . pantoprazole sodium  40 mg Per Tube Q1200  . potassium chloride  40 mEq Per Tube BID   Continuous Infusions: . dexmedetomidine 2 mcg/kg/hr (05/12/15 1414)  . feeding supplement (VITAL 1.5 CAL) 1,000 mL (05/12/15 0600)  . fentaNYL infusion INTRAVENOUS 150 mcg/hr (05/12/15  1338)    Assessment/Plan:  1. 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 Metabolic encephalopathy. Mental status will be the key on her prognosis. Patient is on Keppra and neurology is following. Last EEG showed diffuse slowing but no active seizure. Repeat CT scan of the head ordered by neurology. 2. Ventricular tachycardia cardiac arrest and lactic acidosis 3. Aspiration pneumonia, high fever- patient placed on vancomycin and ceftazidime 4. Acute respiratory failure with hypoxia- patient required reintubation. Full vent support at this time, now requiring 100% oxygen, one dose lasix given for fluid overload and x-ray ordered. Children  are leaning against tracheostomy. Likely this is acute on chronic systolic congestive heart failure and fluid overload from all the medications and IV fluids given. 5. Hypokalemia- electrolyte protocol 6. Anemia- hemoglobin now trending down to 8.3. 7.   Rhabdomyolysis- patient's CPK is trending better. Likely secondary to status epilepticus. 8.   Shock liver- liver enzymes still elevated. PT slightly high on 05/08/2015. 9.   Diarrhea- rectal tube in.  Code Status:     Code Status Orders        Start     Ordered   May 31, 2015 2311  Full code   Continuous     May 31, 2015 2310     Family Communication: Children at the bedside. Disposition Plan: To be determined  Consultants:  Critical care specialist  Neurology  Antibiotics:  Vancomycin  Zosyn  Time spent: 38 minutes, patient is critically ill and high risk for cardiopulmonary arrest.   Alford Highland  [facility]

## 2015-05-12 NOTE — Progress Notes (Signed)
ANTIBIOTIC CONSULT NOTE - FOLLOW UP  Pharmacy Consult for Ceftazidime Indication: HCAP  No Known Allergies  Patient Measurements: Height:  (165.1 cm) Weight: 164 lb 3.9 oz (74.5 kg) IBW/kg (Calculated) : 57  Vital Signs: Temp: 103.2 F (39.6 C) (10/13 0930) Temp Source: Oral (10/13 0930) BP: 112/72 mmHg (10/13 1000) Pulse Rate: 102 (10/13 1000) Intake/Output from previous day: 10/12 0701 - 10/13 0700 In: 5217 [I.V.:2953.3; NG/GT:1283.7; IV Piggyback:980] Out: 1590 [Urine:1590] Intake/Output from this shift: Total I/O In: 781.8 [I.V.:276.8; NG/GT:395; IV Piggyback:110] Out: 250 [Urine:250]  Labs:  Recent Labs  05/10/15 0510 05/10/15 1024 05/11/15 0500 05/12/15 0359  WBC 9.7  --  12.8* 17.0*  HGB 9.0*  --  8.4* 8.3*  PLT 142*  --  147* 140*  CREATININE 0.69 0.65 0.88 0.75   Estimated Creatinine Clearance: 97.3 mL/min (by C-G formula based on Cr of 0.75).  Recent Labs  05/12/15 0811  [facility] 11     Microbiology: Recent Results (from the past 720 hour(s))  MRSA PCR Screening     Status: None   Collection Time: 05/21/2015 11:05 PM  Result Value Ref Range Status   MRSA by PCR NEGATIVE NEGATIVE Final    Comment:        The GeneXpert MRSA Assay (FDA approved for NASAL specimens only), is one component of a comprehensive MRSA colonization surveillance program. It is not intended to diagnose MRSA infection nor to guide or monitor treatment for MRSA infections.   Urine culture     Status: None   Collection Time: 05/07/15  8:32 AM  Result Value Ref Range Status   Specimen Description URINE, CATHETERIZED  Final   Special Requests Normal  Final   Culture NO GROWTH 2 DAYS  Final   Report Status 05/09/2015 FINAL  Final  Culture, blood (routine x 2)     Status: None   Collection Time: 05/07/15  8:43 AM  Result Value Ref Range Status   Specimen Description BLOOD RIGHT AC  Final   Special Requests BOTTLES DRAWN AEROBIC AND ANAEROBIC  7CC  Final   Culture NO GROWTH 5 DAYS  Final   Report Status 05/12/2015 FINAL  Final  Culture, blood (routine x 2)     Status: None   Collection Time: 05/07/15  8:55 AM  Result Value Ref Range Status   Specimen Description BLOOD RIGHT HAND  Final   Special Requests   Final    BOTTLES DRAWN AEROBIC AND ANAEROBIC  AER 7CC ANA 10CC   Culture NO GROWTH 5 DAYS  Final   Report Status 05/12/2015 FINAL  Final  Culture, respiratory (NON-Expectorated)     Status: None   Collection Time: 05/09/15  3:47 PM  Result Value Ref Range Status   Specimen Description TRACHEAL ASPIRATE  Final   Special Requests NONE  Final   Gram Stain   Final    EXCELLENT SPECIMEN - 90-100% WBCS MODERATE WBC SEEN RARE GRAM POSITIVE COCCI    Culture APPEARS TO BE NORMAL RESPIRATORY FLORA  Final   Report Status 05/11/2015 FINAL  Final    Anti-infectives    Start     Dose/Rate Route Frequency Ordered Stop   05/10/15 2000  vancomycin (VANCOCIN) 1,250 mg in sodium chloride 0.9 % 250 mL IVPB  Status:  Discontinued     1,250 mg 166.7 mL/hr over 90 Minutes Intravenous Every 12 hours 05/10/15 1103 05/12/15 0959   05/10/15 1200  vancomycin (VANCOCIN) 1,250 mg in sodium chloride 0.9 % 250 mL  IVPB     1,250 mg 166.7 mL/hr over 90 Minutes Intravenous  Once 05/10/15 1103 05/10/15 1424   05/10/15 1115  cefTAZidime (FORTAZ) 2 g in dextrose 5 % 50 mL IVPB     2 g 100 mL/hr over 30 Minutes Intravenous 3 times per day 05/10/15 1103     05/09/15 1300  vancomycin (VANCOCIN) IVPB 750 mg/150 ml premix  Status:  Discontinued     750 mg 150 mL/hr over 60 Minutes Intravenous Every 12 hours 05/09/15 0918 05/09/15 0952   05/09/15 0130  vancomycin (VANCOCIN) IVPB 750 mg/150 ml premix  Status:  Discontinued     750 mg 150 mL/hr over 60 Minutes Intravenous Every 8 hours 05/08/15 1644 05/09/15 0918   05/08/15 2345  piperacillin-tazobactam (ZOSYN) IVPB 3.375 g  Status:  Discontinued     3.375 g 12.5 mL/hr over 240 Minutes Intravenous 3 times per day  05/08/15 2335 05/09/15 0952   05/08/15 1645  vancomycin (VANCOCIN) 1,750 mg in sodium chloride 0.9 % 500 mL IVPB     1,750 mg 250 mL/hr over 120 Minutes Intravenous  Once 05/08/15 1644 05/08/15 1933     Assessment: 39 yo female admitted for bupropion OD, acute respiratory failure and seizures. Patient failed extubation on 10/10. Pharmacy consulted on 10/11 for dosing and monitoring of Vancomycin and Ceftazidime for HCAP. Vancomycin discontinued 10/13, currently patient is receiving ceftazidime 2g every 8 hours.   CXR today shows slight improvement, Tmax 103.2, Blood cultures X2 no NG x5 days respiratory culture showing normal respiratory flora. CrCl 97.3 , Scr 0.75, WBC 17 (up from 12.8).    Plan:  Will continue Ceftazidime 2gm every 8 hours. Pharmacy will continue to monitor renal function and labs and make adjustments as needed.   Cher NakaiSheema Vincenta Steffey, PharmD Pharmacy Resident

## 2015-05-12 NOTE — Clinical Documentation Improvement (Addendum)
Hospitalist or PCCM  (if you agree, please document your response in the progress note and discharge summary, not on the query form itself.)  Lasix 40 mg IV x 1 was order by Dr. Sung AmabileSimonds and given on 05/12/15 per review of orders. Addendum - Lasix 80 mg IV was repeated 05/12/15 at 6:25 pm.   CXR 05/12/15 at 05/12/15 at 1841 -  Severe pulmonary edema much worse compared to previous earlier exam.    Please document a diagnosis, including the Acuity, that required treatment with IV Lasix:  - Acute Systolic Heart Failure  - Acute Pulmonary Edema  - Other condition requiring treatment with Lasix  - Unable to clinically determine   Please exercise your independent, professional judgment when responding. A specific answer is not anticipated or expected.   Thank You, Jerral Ralphathy R Asianna Brundage  RN BSN CCDS [phone] Health Information Management [place]

## 2015-05-12 NOTE — Progress Notes (Signed)
Patient on cooling blanket and temperature gradually decreasing.  RN monitoring patient's temp.

## 2015-05-12 NOTE — Progress Notes (Signed)
RN notified Dr. Sung AmabileSimonds that patient is maxed out on precedex drip at 2 mcg/kg/H and that IV fentanyl 100mcg has been given every hour recently per PRN order and most recently it was given at 1310 and patient remains agitated with RR 37/min.  O2 sats dropped requiring fio2 to be increased to 100% and peep increased to 10.  Dr. Sung AmabileSimonds stated "i will put her on a fentanyl infusion."

## 2015-05-12 NOTE — Progress Notes (Signed)
Pt profile: [age] F with multiple previous suicide attempts took large OD of bupropion 10/07 c/b acute resp failure, protracted seizures, shock. Improved 10/10 and able to F/C intermittently. Failed extubation 10/10 with pulm edema pattern on CXR after re-intubation  Active problems: VDRF Pulm edema Possible aspiration Failed extubation 10/10 Cardiomyopathy (LVEF 40%) Shock, resolved Hypokalemia Acute encephalopathy Seizures due to bupropion OD   Lines, Tubes, etc: ETT 10/07 >> 10/10, 10/10 >>  R femoral A-line 10/09 >> 10/12 R IJ CVL 10/09 >>    Microbiology: MRSA PCR 10/07 >> NEG Urine 10/08 >> NEG Resp 10/10 >> NOF Blood 10/08 >> NEG PCT 10/11: 4.32, 1.98,   Antibiotics:  Pip-tazo 10/09 >> 10/10 Vanc 10/09 >>  Ceftaz 10/11 >>   Studies/Events: 10/08 CT head: normal 10/09 TTE: LVEF 40-45% 10/10 Failed extubation. 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 10/12 Ct head: NAD  Consults:  PCCM 10/08 Neuro 10/08  Best Practice: DVT: LMWH SUP: PPI Nutrition: TFs 10/11 Glycemic control: no SSI indicated Sedation/analgesia: Propofol  Subj: RASS --2 to +2, + F/C intermittent  Obj: Filed Vitals:   05/12/15 1300  BP: 132/83  Pulse: 106  Temp:   Resp: 24    Gen: WDWN, RASS -1, intubated HEENT: NCAT, EOMI, PERRL Neck: No JVD Chest: diffuse rhonchi Cardiac: reg, no M Abd: soft, NT Ext: warm, no edema Neuro: MAEs vigorously to stimulation  BMET BMP Latest Ref Rng 05/12/2015 05/11/2015 05/10/2015  Glucose 65 - 99 mg/dL 161(W106(H) 87 73  BUN 6 - 20 mg/dL 17 15 15   Creatinine 0.44 - 1.00 mg/dL 9.600.75 4.540.88 0.980.65  Sodium 135 - 145 mmol/L 147(H) 146(H) 141  Potassium 3.5 - 5.1 mmol/L 3.6 3.2(L) 3.4(L)  Chloride 101 - 111 mmol/L 117(H) 113(H) 111  CO2 22 - 32 mmol/L 25 27 29   Calcium 8.9 - 10.3 mg/dL 7.8(L) 7.8(L) 7.2(L)     CBC CBC Latest Ref Rng 05/12/2015  05/11/2015 05/10/2015  WBC 3.6 - 11.0 K/uL 17.0(H) 12.8(H) 9.7  Hemoglobin 12.0 - 16.0 g/dL 8.3(L) 8.4(L) 9.0(L)  Hematocrit 35.0 - 47.0 % 25.6(L) 26.2(L) 26.8(L)  Platelets 150 - 440 K/uL 140(L) 147(L) 142(L)      CXR: slightly improved edema/ALI pattern   IMPRESSION: VDRF Failed extubation 10/10 pulm edema Likely aspiration Acute lung injury/ARDS - improving Cardiomyopathy - likely toxic Hypokalemia Hypernatremia Rhabdomyolysis - CK improving Lactic acidosis, resolved Elevated LFTs - improving Severe TME - improving Agitated delirium  PLAN/RECS:  Cont vent support - settings reviewed and/or adjusted Cont vent bundle Daily SBT if/when meets criteria MAP goal > 60 mmHg Monitor BMET intermittently Monitor I/Os Correct electrolytes as indicated Cont SUP and DVT px Cont TFs Cont anticonvulsants per Neuro Cont dexmedetomidine Initiate fentanyl gtt 10/13 Family updated @ bedside  CCM time: 40 mins The above time includes time spent in consultation with patient and/or family members and reviewing care plan on multidisciplinary rounds  Billy Fischeravid Dezzie Badilla, MD PCCM service Mobile [phone] Pager [phone]

## 2015-05-12 NOTE — Progress Notes (Signed)
Dr. Sherene SiresWert cameraed in and RN notified MD that patient is now back to 100% o2 sats and that patient is currently on 100% fio2 and peep of 10 and that large amount of pink frothy secretions suctioned from ET tube, no respiratory distress at this time and patient breathing 20/min and vent set at 18.  John, RRT in room and spoke with Dr. Sherene SiresWert also and MD gave order to increase rate on vent and MD to place orders including chest xray.

## 2015-05-12 NOTE — Progress Notes (Signed)
Patient following commands and calm at this time. Fever reduced and is 100.1 rectally. Continuing to monitor. Will obtain CVP per Dr. Thurston HoleWert's order.

## 2015-05-12 NOTE — Progress Notes (Signed)
Fentanyl drip has been titrated up from 15150mcg/H to 25650mcg/H gradually since 1338 due to patient being agitated and tachypnic.  Ativan given PRN for anxiety at 1558 which helped decrease anxiety and agitation.  Patient remains calm when room is completely quite and care is not being given, but when IV pump beeps or RN touches patient she becomes startled easily and seems hypersensitive to noise and appears scared. RN encourages patient by talking to her and trying to reorient her in attempt to calm patient.  Patient follows commands intermittently but mostly agitated with care.  RN made Dr. Sung AmabileSimonds aware of all mentioned above and MD gave no new orders acknowledging only.

## 2015-05-12 NOTE — Progress Notes (Signed)
Elink notified by this RN in room at 1800 that patient desated into 70% on 100% fio2 and that Peep was increased to 12 by Jonny RuizJohn RRT and that patient was then bagged to increase sats and that sats came up to 83% while bagged.  ETT suctioned several times and pink frothy secretions suctioned.  Coarse breath sounds in all lung fields. Elink RN will notify MD and MD to get back to this RN. Patient now back on ventilator with o2 sats 100% on 100% fio2

## 2015-05-12 NOTE — Progress Notes (Signed)
Nutrition Follow-up    INTERVENTION:   EN: recommend continuing current TF regimen as tolerated as per order set, free water modified by MD. Continue to assess  NUTRITION DIAGNOSIS:   Inadequate oral intake related to acute illness as evidenced by NPO status. Being addressed via TF  GOAL:   Provide needs based on ASPEN/SCCM guidelines  MONITOR:    (Energy Intake, Anthropometrics, Electrolyte/Renal Profile, Glucose Profile, Digestive System)  REASON FOR ASSESSMENT:   Ventilator    ASSESSMENT:   Pt remains on vent, febrile,   EN: tolerating Vital 1.5 TF at rate of 45 ml/hr, Prostat BID, free water changed to 200 mL q 4 hours  Last BM:  10/13 large loose stool   Electrolyte and Renal Profile:  Recent Labs Lab 05/05/15 2100  05/08/15 0508  05/10/15 1024 05/11/15 0500 05/12/15 0359  BUN  --   < >  --   < > 15 15 17   CREATININE  --   < >  --   < > 0.65 0.88 0.75  NA  --   < >  --   < > 141 146* 147*  K  --   < >  --   < > 3.4* 3.2* 3.6  MG 2.0  --  2.0  --   --   --   --   < > = values in this interval not displayed. Glucose Profile:  Recent Labs  05/12/15 0001 05/12/15 0358 05/12/15 0716  GLUCAP 116* 101* 118*   Meds: lasix, precedex, potassium chloride, lactulose  Height:   Ht Readings from Last 1 Encounters:  05/20/2015 5\' 5"  (1.651 m)    Weight:   Wt Readings from Last 1 Encounters:  05/12/15 164 lb 3.9 oz (74.5 kg)   Filed Weights   05/10/2015 1756 05/11/15 0559 05/12/15 0434  Weight: 164 lb 4.8 oz (74.526 kg) 166 lb 0.1 oz (75.3 kg) 164 lb 3.9 oz (74.5 kg)    BMI:  Body mass index is 27.33 kg/(m^2).  Estimated Nutritional Needs:   Kcal:  1846 kcals (BEE: 1466, Ve: 12, Tmax: 37.8) using current wt of 74.5  Protein:  89-149 g (1.2-2.0 g/kg)   Fluid:  1875-2250 mL (25-30 ml/kg)   HIGH Care Level  Romelle Starcherate Jamyria Ozanich MS, RD, LDN [phone] Pager

## 2015-05-12 NOTE — Progress Notes (Addendum)
RN notified Dr. Sherene SiresWert of chest xray results and CVP of 16 and that patient has had 450cc of UOP since lasix was given. MD acknowledged and gave no new orders at this time. Patient resting and following commands intermittently. Pupils 2mm bilaterally and sluggish. NSR per cardiac monitor.  Coarse breath sounds bilaterally. o2 sats mid 90's on 100% fio2 and 12 of peep.  Non-labored breathing. Flexi seal in place with light brown liquid stool. Excellent UOP this shift. Daughter and son visited during shift.  Son changed patient password and only persons with new password are permitted to visit per son and daughter's request.

## 2015-05-13 ENCOUNTER — Inpatient Hospital Stay
Admit: 2015-05-13 | Discharge: 2015-05-13 | Disposition: A | Discharge status: Home / Self Care | Payer: Medicaid Other | Attending: Pulmonary Disease | Admitting: Pulmonary Disease

## 2015-05-13 ENCOUNTER — Inpatient Hospital Stay: Payer: Medicaid Other

## 2015-05-13 DIAGNOSIS — J189 Pneumonia, unspecified organism: Secondary | ICD-10-CM

## 2015-05-13 LAB — COMPREHENSIVE METABOLIC PANEL
ALBUMIN: 2.2 g/dL — AB (ref 3.5–5.0)
ALT: 1172 U/L — AB (ref 14–54)
AST: 186 U/L — ABNORMAL HIGH (ref 15–41)
Alkaline Phosphatase: 100 U/L (ref 38–126)
Anion gap: 7 (ref 5–15)
BILIRUBIN TOTAL: 0.7 mg/dL (ref 0.3–1.2)
BUN: 23 mg/dL — AB (ref 6–20)
CALCIUM: 8.4 mg/dL — AB (ref 8.9–10.3)
CO2: 31 mmol/L (ref 22–32)
CREATININE: 0.64 mg/dL (ref 0.44–1.00)
Chloride: 115 mmol/L — ABNORMAL HIGH (ref 101–111)
GFR calc Af Amer: >60 mL/min (ref >60)
GFR calc non Af Amer: >60 mL/min (ref >60)
GLUCOSE: 120 mg/dL — AB (ref 65–99)
Potassium: 3.5 mmol/L (ref 3.5–5.1)
SODIUM: 153 mmol/L — AB (ref 135–145)
TOTAL PROTEIN: 6 g/dL — AB (ref 6.5–8.1)

## 2015-05-13 LAB — GLUCOSE, CAPILLARY
GLUCOSE-CAPILLARY: 125 mg/dL — AB (ref 65–99)
Glucose-Capillary: 102 mg/dL — ABNORMAL HIGH (ref 65–99)
Glucose-Capillary: 110 mg/dL — ABNORMAL HIGH (ref 65–99)
Glucose-Capillary: 114 mg/dL — ABNORMAL HIGH (ref 65–99)
Glucose-Capillary: 124 mg/dL — ABNORMAL HIGH (ref 65–99)
Glucose-Capillary: 133 mg/dL — ABNORMAL HIGH (ref 65–99)

## 2015-05-13 LAB — CBC
HEMATOCRIT: 25.2 % — AB (ref 35.0–47.0)
HEMOGLOBIN: 8.3 g/dL — AB (ref 12.0–16.0)
MCH: 29.5 pg (ref 26.0–34.0)
MCHC: 33 g/dL (ref 32.0–36.0)
MCV: 89.2 fL (ref 80.0–100.0)
Platelets: 139 10*3/uL — ABNORMAL LOW (ref 150–440)
RBC: 2.83 MIL/uL — AB (ref 3.80–5.20)
RDW: 14.1 % (ref 11.5–14.5)
WBC: 19.6 10*3/uL — ABNORMAL HIGH (ref 3.6–11.0)

## 2015-05-13 LAB — URINALYSIS COMPLETE WITH MICROSCOPIC (ARMC ONLY)
BILIRUBIN URINE: NEGATIVE
GLUCOSE, UA: 50 mg/dL — AB
Hgb urine dipstick: NEGATIVE
Ketones, ur: NEGATIVE mg/dL
LEUKOCYTES UA: NEGATIVE
NITRITE: NEGATIVE
Protein, ur: 100 mg/dL — AB
Specific Gravity, Urine: 1.025 (ref 1.005–1.030)
pH: 6 (ref 5.0–8.0)

## 2015-05-13 LAB — PROCALCITONIN: Procalcitonin: 1.47 ng/mL

## 2015-05-13 LAB — CK: Total CK: 857 U/L — ABNORMAL HIGH (ref 38–234)

## 2015-05-13 MED ORDER — FUROSEMIDE 10 MG/ML IJ SOLN
40.0000 mg | Freq: Two times a day (BID) | INTRAMUSCULAR | Status: DC
  Administered 2015-05-13: 40 mg via INTRAVENOUS
  Filled 2015-05-13: qty 4

## 2015-05-13 MED ORDER — SODIUM CHLORIDE 0.9 % IV SOLN
500.0000 mg | Freq: Two times a day (BID) | INTRAVENOUS | Status: DC
  Administered 2015-05-13 – 2015-05-19 (×12): 500 mg via INTRAVENOUS
  Filled 2015-05-13 (×13): qty 5

## 2015-05-13 MED ORDER — DEXTROSE 5 % IV SOLN
INTRAVENOUS | Status: DC
  Administered 2015-05-13 – 2015-05-14 (×2): via INTRAVENOUS

## 2015-05-13 MED ORDER — POTASSIUM CHLORIDE 20 MEQ/15ML (10%) PO SOLN
40.0000 meq | Freq: Two times a day (BID) | ORAL | Status: AC
  Administered 2015-05-13 (×2): 40 meq
  Filled 2015-05-13 (×2): qty 30

## 2015-05-13 NOTE — Progress Notes (Signed)
Pt profile: 38 F with multiple previous suicide attempts took large OD of bupropion 10/07 c/69b acute resp failure, protracted seizures, shock. Improved 10/10 and able to F/C intermittently. Failed extubation 10/10 with pulm edema pattern on CXR after re-intubation  Active problems: VDRF Pulm edema Possible aspiration Failed extubation 10/10 Cardiomyopathy (LVEF 40%) Shock, resolved Hypokalemia Acute encephalopathy Seizures due to bupropion OD   Lines, Tubes, etc: ETT 10/07 >> 10/10, 10/10 >>  R femoral A-line 10/09 >> 10/12 R IJ CVL 10/09 >>    Microbiology: MRSA PCR 10/07 >> NEG Urine 10/08 >> NEG Resp 10/10 >> NOF Blood 10/08 >> NEG PCT 10/11: 4.32, 1.98, 1.47 Urine 10/14 >>  Resp 10/14 >>  Blood 10/14 >>     Antibiotics:  Pip-tazo 10/09 >> 10/10 Vanc 10/09 >> 10/13 Ceftaz 10/11 >>   Studies/Events: 10/08 CT head: normal 10/09 TTE: LVEF 40-45% 10/10 Failed extubation. 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 CXR with severe edema. Received Lasix with marked improvement 10/14 TTE (repeat):   Consults:  PCCM 10/08 Neuro 10/08  Best Practice: DVT: LMWH SUP: PPI Nutrition: TFs 10/11 Glycemic control: no SSI indicated Sedation/analgesia: dexmedetomidine/fentanyl  Subj: RASS --2 to +2, + F/C   Obj: Filed Vitals:   05/13/15 1100  BP: 115/76  Pulse: 91  Temp:   Resp: 18    Gen: WDWN, RASS -1, intubated HEENT: NCAT, EOMI, PERRL Neck: No JVD Chest: clear anteriorly Cardiac: reg, no M Abd: soft, NT Ext: warm, no edema Neuro: MAEs vigorously to stimulation  BMET BMP Latest Ref Rng 05/13/2015 05/12/2015 05/11/2015  Glucose 65 - 99 mg/dL 161(W120(H) 960(A106(H) 87  BUN 6 - 20 mg/dL 54(U23(H) 17 15  Creatinine 0.44 - 1.00 mg/dL 9.810.64 1.910.75 4.780.88  Sodium 135 - 145 mmol/L 153(H) 147(H) 146(H)   Potassium 3.5 - 5.1 mmol/L 3.5 3.6 3.2(L)  Chloride 101 - 111 mmol/L 115(H) 117(H) 113(H)  CO2 22 - 32 mmol/L 31 25 27   Calcium 8.9 - 10.3 mg/dL 2.9(F8.4(L) 7.8(L) 7.8(L)     CBC CBC Latest Ref Rng 05/13/2015 05/12/2015 05/11/2015  WBC 3.6 - 11.0 K/uL 19.6(H) 17.0(H) 12.8(H)  Hemoglobin 12.0 - 16.0 g/dL 8.3(L) 8.3(L) 8.4(L)  Hematocrit 35.0 - 47.0 % 25.2(L) 25.6(L) 26.2(L)  Platelets 150 - 440 K/uL 139(L) 140(L) 147(L)      CXR (10/13): severe edema CXR (10/14): markedly improved edema pattern, persistent consolidation of LLL   IMPRESSION: VDRF Failed extubation 10/10 pulm edema - recurrent LLL aspiration PNA Acute lung injury/ARDS - improving Cardiomyopathy - likely toxic Hypokalemia Hypernatremia Rhabdomyolysis - CK improving Lactic acidosis, resolved Elevated LFTs - improving Severe TME - improving Agitated delirium, controlled  PLAN/RECS:  Cont vent support - settings reviewed and/or adjusted Cont vent bundle Daily SBT if/when meets criteria MAP goal > 60 mmHg Monitor BMET intermittently Monitor I/Os Correct electrolytes as indicated Cont SUP and DVT px as above Cont TFs Cont anticonvulsants  - dose adjusted 10/14 Cont fent/dexmedetomidine Daily WUA Recheck TTE 10/14 Reculture 10/14  CCM time: 40 mins The above time includes time spent in consultation with patient and/or family members and reviewing care plan on multidisciplinary rounds  Billy Fischeravid Simonds, MD PCCM service Mobile [phone] Pager [phone]

## 2015-05-13 NOTE — Progress Notes (Signed)
Increased PEEP to 10 per verbal order from Dr. Ardyth Manam

## 2015-05-13 NOTE — Progress Notes (Signed)
Nutrition Follow-up      INTERVENTION:  EN: continue vital 1.5 at goal rate of 4145ml/hr with prostat BID.    NUTRITION DIAGNOSIS:   Inadequate oral intake related to acute illness as evidenced by NPO status, being addressed with tube feeding    GOAL:   Provide needs based on ASPEN/SCCM guidelines  Meeting goals  MONITOR:    (Energy Intake, Anthropometrics, Electrolyte/Renal Profile, Glucose Profile, Digestive System)  REASON FOR ASSESSMENT:   Ventilator    ASSESSMENT:      Pt remains on vent    Current Nutrition: Tolerating vital 1.5 at goal rate of 4045ml/hr with prostat BID, discussed with RN Annabelle Harmanana   Gastrointestinal Profile: residuals 200ml or less Last BM: rectal pouch with 1280ml last 24 hr noted   Medications: D5 at 1175ml/hr started (providing 306 kcals/d), KCL  Electrolyte/Renal Profile and Glucose Profile:   Recent Labs Lab 05/08/15 0508  05/11/15 0500 05/12/15 0359 05/13/15 0456  NA  --   < > 146* 147* 153*  K  --   < > 3.2* 3.6 3.5  CL  --   < > 113* 117* 115*  CO2  --   < > 27 25 31   BUN  --   < > 15 17 23*  CREATININE  --   < > 0.88 0.75 0.64  CALCIUM  --   < > 7.8* 7.8* 8.4*  MG 2.0  --   --   --   --   GLUCOSE  --   < > 87 106* 120*  < > = values in this interval not displayed. Protein Profile:   Recent Labs Lab 05/11/15 0500 05/12/15 0359 05/13/15 0456  ALBUMIN 2.5* 2.3* 2.2*      Weight Trend since Admission: Filed Weights   05/11/15 0559 05/12/15 0434 05/13/15 0425  Weight: 166 lb 0.1 oz (75.3 kg) 164 lb 3.9 oz (74.5 kg) 164 lb 7.4 oz (74.6 kg)      Diet Order:   NPO  Skin:   reviewed   Height:   Ht Readings from Last 1 Encounters:  08/12/2014 5\' 5"  (1.651 m)    Weight:   Wt Readings from Last 1 Encounters:  05/13/15 164 lb 7.4 oz (74.6 kg)     BMI:  Body mass index is 27.37 kg/(m^2).  Estimated Nutritional Needs:   Kcal:  1846 kcals (BEE: 1466, Ve: 12, Tmax: 37.8) using current wt of 74.5  Protein:   89-149 g (1.2-2.0 g/kg)   Fluid:  1875-2250 mL (25-30 ml/kg)   EDUCATION NEEDS:   No education needs identified at this time  HIGH Care Level  Lanay Zinda B. Freida BusmanAllen, RD, LDN [phone] (pager)

## 2015-05-13 NOTE — Progress Notes (Signed)
ANTIBIOTIC CONSULT NOTE - FOLLOW UP  Pharmacy Consult for Ceftazidime Indication: HCAP  No Known Allergies  Patient Measurements: Height:  (165.1 cm) Weight: 164 lb 7.4 oz (74.6 kg) IBW/kg (Calculated) : 57  Vital Signs: Temp: 99.9 F (37.7 C) (10/14 0700) Temp Source: Rectal (10/14 0800) BP: 131/92 mmHg (10/14 0800) Pulse Rate: 79 (10/14 0800) Intake/Output from previous day: 10/13 0701 - 10/14 0700 In: 3836.9 [I.V.:1416.9; NG/GT:2050; IV Piggyback:370] Out: 7580 [Urine:6300; Stool:1280] Intake/Output from this shift: Total I/O In: 95.1 [I.V.:50.1; NG/GT:45] Out: 1525 [Urine:1525]  Labs:  Recent Labs  05/11/15 0500 05/12/15 0359 05/13/15 0456  WBC 12.8* 17.0* 19.6*  HGB 8.4* 8.3* 8.3*  PLT 147* 140* 139*  CREATININE 0.88 0.75 0.64   Estimated Creatinine Clearance: 97.3 mL/min (by C-G formula based on Cr of 0.64).  Recent Labs  05/12/15 0811  [facility] 11     Microbiology: Recent Results (from the past 720 hour(s))  MRSA PCR Screening     Status: None   Collection Time: 05-22-2015 11:05 PM  Result Value Ref Range Status   MRSA by PCR NEGATIVE NEGATIVE Final    Comment:        The GeneXpert MRSA Assay (FDA approved for NASAL specimens only), is one component of a comprehensive MRSA colonization surveillance program. It is not intended to diagnose MRSA infection nor to guide or monitor treatment for MRSA infections.   Urine culture     Status: None   Collection Time: 05/07/15  8:32 AM  Result Value Ref Range Status   Specimen Description URINE, CATHETERIZED  Final   Special Requests Normal  Final   Culture NO GROWTH 2 DAYS  Final   Report Status 05/09/2015 FINAL  Final  Culture, blood (routine x 2)     Status: None   Collection Time: 05/07/15  8:43 AM  Result Value Ref Range Status   Specimen Description BLOOD RIGHT AC  Final   Special Requests BOTTLES DRAWN AEROBIC AND ANAEROBIC  7CC  Final   Culture NO GROWTH 5 DAYS  Final   Report  Status 05/12/2015 FINAL  Final  Culture, blood (routine x 2)     Status: None   Collection Time: 05/07/15  8:55 AM  Result Value Ref Range Status   Specimen Description BLOOD RIGHT HAND  Final   Special Requests   Final    BOTTLES DRAWN AEROBIC AND ANAEROBIC  AER 7CC ANA 10CC   Culture NO GROWTH 5 DAYS  Final   Report Status 05/12/2015 FINAL  Final  Culture, respiratory (NON-Expectorated)     Status: None   Collection Time: 05/09/15  3:47 PM  Result Value Ref Range Status   Specimen Description TRACHEAL ASPIRATE  Final   Special Requests NONE  Final   Gram Stain   Final    EXCELLENT SPECIMEN - 90-100% WBCS MODERATE WBC SEEN RARE GRAM POSITIVE COCCI    Culture APPEARS TO BE NORMAL RESPIRATORY FLORA  Final   Report Status 05/11/2015 FINAL  Final    Anti-infectives    Start     Dose/Rate Route Frequency Ordered Stop   05/10/15 2000  vancomycin (VANCOCIN) 1,250 mg in sodium chloride 0.9 % 250 mL IVPB  Status:  Discontinued     1,250 mg 166.7 mL/hr over 90 Minutes Intravenous Every 12 hours 05/10/15 1103 05/12/15 0959   05/10/15 1200  vancomycin (VANCOCIN) 1,250 mg in sodium chloride 0.9 % 250 mL IVPB     1,250 mg 166.7 mL/hr over 90 Minutes  Intravenous  Once 05/10/15 1103 05/10/15 1424   05/10/15 1115  cefTAZidime (FORTAZ) 2 g in dextrose 5 % 50 mL IVPB     2 g 100 mL/hr over 30 Minutes Intravenous 3 times per day 05/10/15 1103     05/09/15 1300  vancomycin (VANCOCIN) IVPB 750 mg/150 ml premix  Status:  Discontinued     750 mg 150 mL/hr over 60 Minutes Intravenous Every 12 hours 05/09/15 0918 05/09/15 0952   05/09/15 0130  vancomycin (VANCOCIN) IVPB 750 mg/150 ml premix  Status:  Discontinued     750 mg 150 mL/hr over 60 Minutes Intravenous Every 8 hours 05/08/15 1644 05/09/15 0918   05/08/15 2345  piperacillin-tazobactam (ZOSYN) IVPB 3.375 g  Status:  Discontinued     3.375 g 12.5 mL/hr over 240 Minutes Intravenous 3 times per day 05/08/15 2335 05/09/15 0952   05/08/15 1645   vancomycin (VANCOCIN) 1,750 mg in sodium chloride 0.9 % 500 mL IVPB     1,750 mg 250 mL/hr over 120 Minutes Intravenous  Once 05/08/15 1644 05/08/15 1933     Assessment: 39 yo female admitted for bupropion OD, acute respiratory failure and seizures. Patient failed extubation on 10/10. Pharmacy consulted on 10/11 for dosing and monitoring of Vancomycin and Ceftazidime for HCAP. Vancomycin discontinued 10/13, currently patient is receiving ceftazidime 2g every 8 hours.   Patient remains on full vent support.CXR today showing improvement in bilateral pulmonary infiltrates, Tmax 103.2 in last 24 hours, but slowly been improving over past 12 hours. Blood cultures X2 no NG x5 days respiratory culture showing normal respiratory flora. O2 sat improved over last 24 hours, CrCl 97.3 , Scr 0.64, WBC 19.6 (up from 17).    Plan:  Will continue Ceftazidime 2gm every 8 hours. Pharmacy will continue to monitor renal function and labs and make adjustments as needed.   Cher NakaiSheema [facility], PharmD Pharmacy Resident

## 2015-05-13 NOTE — Care Management Note (Signed)
Case Management Note  Patient Details  Name: Noni SaupeRobin Cutshaw MRN: [id number] Date of Birth: 31-Dec-1975  Subjective/Objective:    Admitted with suicide attempt with intentional drug overdose. Patient took 92-100 mg Wellbutrin. Remains vented on Precedex and Fentanyl gtts. Flexiseal in place. Elevated rectal temps, aspiration PNA with IV antibiotics, liver shock, CHF and respiratory failure.  WBC 19.6, NA 153. Neurology following.   Son and daughter are next of kin.              Action/Plan: Following progression.   Expected Discharge Date:                  Expected Discharge Plan:     In-House Referral:  Clinical Social Work  Discharge planning Services  CM Consult  Post Acute Care Choice:    Choice offered to:     DME Arranged:    DME Agency:     HH Arranged:    HH Agency:     Status of Service:  In process, will continue to follow  Medicare Important Message Given:    Date Medicare IM Given:    Medicare IM give by:    Date Additional Medicare IM Given:    Additional Medicare Important Message give by:     If discussed at Long Length of Stay Meetings, dates discussed:    Additional Comments:  Marily MemosLisa M Destinae Neubecker, RN 05/13/2015, 9:15 AM

## 2015-05-13 NOTE — Progress Notes (Signed)
Patient ID: Kathleen Gray, female   DOB: [birth year], 39 y.o.   MRN: [id number] [facility] PROGRESS NOTE  PCP: No PCP Per Patient  HPI/Subjective: Patient sedated.  Objective: Filed Vitals:   05/13/15 1500  BP: 104/62  Pulse: 99  Temp:   Resp: 18    Filed Weights   05/11/15 0559 05/12/15 0434 05/13/15 0425  Weight: 75.3 kg (166 lb 0.1 oz) 74.5 kg (164 lb 3.9 oz) 74.6 kg (164 lb 7.4 oz)    ROS: Review of Systems  Unable to perform ROS  secondary to being on the ventilator and on sedation Exam: Physical Exam  Constitutional: She appears lethargic.  HENT:  Nose: No mucosal edema.  Unable to look in mouth.  Eyes: Conjunctivae and lids are normal. Pupils are equal, round, and reactive to light.  Neck: No JVD present. Carotid bruit is not present. No edema present. No thyroid mass and no thyromegaly present.  Cardiovascular: S1 normal and S2 normal.  Exam reveals no gallop.   No murmur heard. Pulses:      Dorsalis pedis pulses are 2+ on the right side, and 2+ on the left side.  Respiratory: No respiratory distress. She has no wheezes. She has no rhonchi. She has rales in the right lower field and the left lower field.  GI: Soft. Bowel sounds are normal. There is no tenderness.  Musculoskeletal:       Right ankle: She exhibits swelling.       Left ankle: She exhibits swelling.  Lymphadenopathy:    She has no cervical adenopathy.  Neurological: She appears lethargic.  Skin: Skin is warm. No rash noted. Nails show no clubbing.  Psychiatric:  No response today sternal rub.    Data Reviewed: Basic Metabolic Panel:  Recent Labs Lab 05/08/15 0508  05/10/15 0510 05/10/15 1024 05/11/15 0500 05/12/15 0359 05/13/15 0456  NA  --   < > 142 141 146* 147* 153*  K  --   < > 3.4* 3.4* 3.2* 3.6 3.5  CL  --   < > 110 111 113* 117* 115*  CO2  --   < > GLUCOSE  --   < > 73 73 87 106* 120*  BUN  --   < > 23*  CREATININE  --   < > 0.69  0.65 0.88 0.75 0.64  CALCIUM  --   < > 7.2* 7.2* 7.8* 7.8* 8.4*  MG 2.0  --   --   --   --   --   --   < > = values in this interval not displayed. Liver Function Tests:  Recent Labs Lab 05/10/15 0510 05/10/15 1024 05/11/15 0500 05/12/15 0359 05/13/15 0456  AST 699* 764* 1247* 649* 186*  ALT 999* 1099* 1895* 1760* 1172*  ALKPHOS 52 63 82 102 100  BILITOT 0.5 1.0 1.1 0.6 0.7  PROT 4.8* 5.2* 5.4* 5.6* 6.0*  ALBUMIN 2.7* 2.6* 2.5* 2.3* 2.2*    Recent Labs Lab 27-May-2015 1744  LIPASE 25    Recent Labs Lab 05/09/15 1741  AMMONIA 34   CBC:  Recent Labs Lab 05-27-15 1744  05/08/15 0508  05/09/15 0917 05/10/15 0510 05/11/15 0500 05/12/15 0359 05/13/15 0456  WBC 10.4  < > 19.5*  < > 12.7* 9.7 12.8* 17.0* 19.6*  NEUTROABS 6.7*  --  17.6*  --   --   --   --   --   --  HGB 14.2  < > 10.7*  < > 10.0* 9.0* 8.4* 8.3* 8.3*  HCT 43.3  < > 32.9*  < > 30.3* 26.8* 26.2* 25.6* 25.2*  MCV 87.1  < > 88.4  < > 88.2 89.2 89.0 90.0 89.2  PLT 298  < > 212  < > 139* 142* 147* 140* 139*  < > = values in this interval not displayed. Cardiac Enzymes:  Recent Labs Lab 05/09/15 0917 05/09/15 1741 05/10/15 0510 05/11/15 0500 05/13/15 0456  CKTOTAL 16109* 15266* 14052* 5905* 857*   BNP (last 3 results)  Recent Labs  05/11/15 0500  BNP 83.0    CBG:  Recent Labs Lab 05/12/15 1942 05/13/15 0012 05/13/15 0406 05/13/15 0732 05/13/15 1142  GLUCAP 116* 124* 102* 114* 133*    Recent Results (from the past 240 hour(s))  MRSA PCR Screening     Status: None   Collection Time: 05/08/2015 11:05 PM  Result Value Ref Range Status   MRSA by PCR NEGATIVE NEGATIVE Final    Comment:        The GeneXpert MRSA Assay (FDA approved for NASAL specimens only), is one component of a comprehensive MRSA colonization surveillance program. It is not intended to diagnose MRSA infection nor to guide or monitor treatment for MRSA infections.   Urine culture     Status: None   Collection  Time: 05/07/15  8:32 AM  Result Value Ref Range Status   Specimen Description URINE, CATHETERIZED  Final   Special Requests Normal  Final   Culture NO GROWTH 2 DAYS  Final   Report Status 05/09/2015 FINAL  Final  Culture, blood (routine x 2)     Status: None   Collection Time: 05/07/15  8:43 AM  Result Value Ref Range Status   Specimen Description BLOOD RIGHT AC  Final   Special Requests BOTTLES DRAWN AEROBIC AND ANAEROBIC  7CC  Final   Culture NO GROWTH 5 DAYS  Final   Report Status 05/12/2015 FINAL  Final  Culture, blood (routine x 2)     Status: None   Collection Time: 05/07/15  8:55 AM  Result Value Ref Range Status   Specimen Description BLOOD RIGHT HAND  Final   Special Requests   Final    BOTTLES DRAWN AEROBIC AND ANAEROBIC  AER 7CC ANA 10CC   Culture NO GROWTH 5 DAYS  Final   Report Status 05/12/2015 FINAL  Final  Culture, respiratory (NON-Expectorated)     Status: None   Collection Time: 05/09/15  3:47 PM  Result Value Ref Range Status   Specimen Description TRACHEAL ASPIRATE  Final   Special Requests NONE  Final   Gram Stain   Final    EXCELLENT SPECIMEN - 90-100% WBCS MODERATE WBC SEEN RARE GRAM POSITIVE COCCI    Culture APPEARS TO BE NORMAL RESPIRATORY FLORA  Final   Report Status 05/11/2015 FINAL  Final     Studies: Dg Chest Port 1 View  05/13/2015  CLINICAL DATA:  Respiratory failure.  Pulmonary infiltrates. EXAM: PORTABLE CHEST 1 VIEW COMPARISON:  05/12/2015 and 05/11/2015 and 05/08/2015 FINDINGS: Endotracheal, NG tube and central line all appear in good position. Bilateral pulmonary infiltrates have improved. No effusions. Heart size and vascularity are normal. IMPRESSION: Interval improvement in the bilateral pulmonary infiltrates. Electronically Signed   By: Francene Boyers M.D.   On: 05/13/2015 07:37   Dg Chest Port 1 View  05/12/2015  CLINICAL DATA:  Hypoxemia on ventilator EXAM: PORTABLE CHEST 1 VIEW COMPARISON:  May 12, 2015 FINDINGS: The heart size  and mediastinal contours are stable. Endotracheal tube is identified with distal tip 5.3 cm from carina. Nasogastric tube is identified distal tip not included. Right central venous line is identified distal tip probably in the right atrial superior vena cava junction or right atrium unchanged. There is severe pulmonary edema. Patchy consolidation of bilateral lung bases are noted. The visualized skeletal structures are stable. IMPRESSION: Severe pulmonary edema much worse compared to previous earlier exam. Electronically Signed   By: Sherian ReinWei-Chen  Lin M.D.   On: 05/12/2015 18:41   Dg Chest Port 1 View  05/12/2015  CLINICAL DATA:  Followup respiratory failure.  Drug overdose. EXAM: PORTABLE CHEST 1 VIEW COMPARISON:  05/11/2015 FINDINGS: Endotracheal tube tip at the clavicular heads. Orogastric tube reaches the stomach at least. Right IJ central line, tip at the upper right atrium and stable. Partial clearing of bilateral dense airspace disease at the bases. Stable heart size. Probable layering left pleural effusion. IMPRESSION: 1. Stable positioning of tubes and central line. 2. Slight improvement in diffuse airspace disease which could reflect noncardiogenic edema, pneumonia, or aspiration. Electronically Signed   By: Marnee SpringJonathon  Watts M.D.   On: 05/12/2015 07:49    Scheduled Meds: . antiseptic oral rinse  7 mL Mouth Rinse QID  . cefTAZidime (FORTAZ)  IV  2 g Intravenous 3 times per day  . chlorhexidine gluconate  15 mL Mouth Rinse BID  . enoxaparin (LOVENOX) injection  40 mg Subcutaneous Q24H  . feeding supplement (PRO-STAT SUGAR FREE 64)  30 mL Oral BID  . free water  200 mL Per Tube Q4H  . levETIRAcetam  500 mg Intravenous Q12H  . pantoprazole sodium  40 mg Per Tube Q1200  . potassium chloride  40 mEq Per Tube BID   Continuous Infusions: . dexmedetomidine 1.6 mcg/kg/hr (05/13/15 0700)  . dextrose 75 mL/hr at 05/13/15 1421  . feeding supplement (VITAL 1.5 CAL) 1,000 mL (05/12/15 1547)  . fentaNYL  infusion INTRAVENOUS 200 mcg/hr (05/13/15 0749)    Assessment/Plan:  1. 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 Metabolic encephalopathy. Patient is on Keppra and neurology is following. Last EEG showed diffuse slowing but no active seizure. Repeat CT scan.  2. Ventricular tachycardia cardiac arrest and lactic acidosis 3. Aspiration pneumonia, high fever- on vancomycin and ceftazidime 4. Acute respiratory failure with hypoxia- patient required reintubation. Full vent support at this time, now requiring 60% oxygen. Increased aeration with diuresis. When I spoke with children yesterday they are leaning against tracheostomy. 5. Acute systolic congestive heart failure and fluid overload- patient receiving a lot of fluids with sedation and antibiotics. Will need intermittent Lasix for diuresis. Was started on fluids for hypernatremia. 6. Hypokalemia- electrolyte protocol 7. Anemia- hemoglobin now trending down to 8.3. 7.   Rhabdomyolysis- patient's CPK is trending better. Likely secondary to status epilepticus. 8.   Shock liver- ALT still elevated above 1000. AST has come down 9.   Diarrhea- rectal tube in, lactulose stopped. 10. Hypernatremia- critical care specialist started on D5W. Fluid overload may be worse with IV fluids. 11. Anxiety depression and suicide attempt-  as per children she does not want to live. Will need psychiatric evaluation once extubated.   Code Status:     Code Status Orders        Start     Ordered   02-Mar-2015 2311  Full code   Continuous     02-Mar-2015 2310     Family Communication: Children yesterday Disposition Plan: To be  determined  Consultants:  Critical care specialist  Neurology  Antibiotics:  Vancomycin  Ceftazidime  Time spent: 31 minutes, patient is critically ill and high risk for cardiopulmonary arrest.   Alford Highland  [facility]

## 2015-05-13 NOTE — Progress Notes (Signed)
*  PRELIMINARY RESULTS* Echocardiogram 2D Echocardiogram has been performed.  Kathleen Gray 05/13/2015, 2:50 PM

## 2015-05-13 NOTE — Progress Notes (Signed)
Dr. Sung AmabileSimonds weaned patient to 60% FIO2 and 8 PEEP, patient tolerating well, O2 sats between 91-95%

## 2015-05-13 NOTE — Progress Notes (Signed)
*  PRELIMINARY RESULTS* Echocardiogram 2D Echocardiogram has been performed.  Georgann HousekeeperJerry R Hege 05/13/2015, 2:51 PM

## 2015-05-14 LAB — COMPREHENSIVE METABOLIC PANEL
ALT: 710 U/L — ABNORMAL HIGH (ref 14–54)
ANION GAP: 5 (ref 5–15)
AST: 84 U/L — AB (ref 15–41)
Albumin: 1.9 g/dL — ABNORMAL LOW (ref 3.5–5.0)
Alkaline Phosphatase: 109 U/L (ref 38–126)
BUN: 19 mg/dL (ref 6–20)
CHLORIDE: 111 mmol/L (ref 101–111)
CO2: 32 mmol/L (ref 22–32)
Calcium: 8.2 mg/dL — ABNORMAL LOW (ref 8.9–10.3)
Creatinine, Ser: 0.48 mg/dL (ref 0.44–1.00)
Glucose, Bld: 114 mg/dL — ABNORMAL HIGH (ref 65–99)
POTASSIUM: 3.2 mmol/L — AB (ref 3.5–5.1)
Sodium: 148 mmol/L — ABNORMAL HIGH (ref 135–145)
Total Bilirubin: 0.6 mg/dL (ref 0.3–1.2)
Total Protein: 5.4 g/dL — ABNORMAL LOW (ref 6.5–8.1)

## 2015-05-14 LAB — CBC
HCT: 24.7 % — ABNORMAL LOW (ref 35.0–47.0)
Hemoglobin: 8.1 g/dL — ABNORMAL LOW (ref 12.0–16.0)
MCH: 29.4 pg (ref 26.0–34.0)
MCHC: 32.6 g/dL (ref 32.0–36.0)
MCV: 90.1 fL (ref 80.0–100.0)
Platelets: 163 10*3/uL (ref 150–440)
RBC: 2.75 MIL/uL — ABNORMAL LOW (ref 3.80–5.20)
RDW: 14.1 % (ref 11.5–14.5)
WBC: 17.1 10*3/uL — ABNORMAL HIGH (ref 3.6–11.0)

## 2015-05-14 LAB — TRIGLYCERIDES: TRIGLYCERIDES: 194 mg/dL — AB (ref <150)

## 2015-05-14 LAB — GLUCOSE, CAPILLARY
GLUCOSE-CAPILLARY: 103 mg/dL — AB (ref 65–99)
GLUCOSE-CAPILLARY: 130 mg/dL — AB (ref 65–99)
GLUCOSE-CAPILLARY: 83 mg/dL (ref 65–99)
Glucose-Capillary: 86 mg/dL (ref 65–99)

## 2015-05-14 MED ORDER — PROPOFOL 1000 MG/100ML IV EMUL
5.0000 ug/kg/min | INTRAVENOUS | Status: DC
  Administered 2015-05-14: 30 ug/kg/min via INTRAVENOUS
  Administered 2015-05-14: 20 ug/kg/min via INTRAVENOUS
  Administered 2015-05-14: 35 ug/kg/min via INTRAVENOUS
  Administered 2015-05-15: 40 ug/kg/min via INTRAVENOUS
  Administered 2015-05-15 (×2): 30 ug/kg/min via INTRAVENOUS
  Administered 2015-05-16 – 2015-05-17 (×7): 50 ug/kg/min via INTRAVENOUS
  Administered 2015-05-17 (×2): 65.508 ug/kg/min via INTRAVENOUS
  Administered 2015-05-17 (×2): 65 ug/kg/min via INTRAVENOUS
  Administered 2015-05-17: 50 ug/kg/min via INTRAVENOUS
  Administered 2015-05-18: 65 ug/kg/min via INTRAVENOUS
  Administered 2015-05-18: 65.285 ug/kg/min via INTRAVENOUS
  Administered 2015-05-18: 65.062 ug/kg/min via INTRAVENOUS
  Administered 2015-05-18 (×2): 65 ug/kg/min via INTRAVENOUS
  Administered 2015-05-18: 65.285 ug/kg/min via INTRAVENOUS
  Administered 2015-05-19 (×3): 65.062 ug/kg/min via INTRAVENOUS
  Administered 2015-05-19: 65 ug/kg/min via INTRAVENOUS
  Filled 2015-05-14 (×27): qty 100

## 2015-05-14 MED ORDER — POTASSIUM CHLORIDE 20 MEQ/15ML (10%) PO SOLN
40.0000 meq | Freq: Two times a day (BID) | ORAL | Status: AC
  Administered 2015-05-14 (×2): 40 meq
  Filled 2015-05-14 (×2): qty 30

## 2015-05-14 MED ORDER — ACETAMINOPHEN 650 MG RE SUPP
650.0000 mg | Freq: Four times a day (QID) | RECTAL | Status: DC
Start: 2015-05-14 — End: 2015-05-15
  Administered 2015-05-14 – 2015-05-15 (×2): 650 mg via RECTAL
  Filled 2015-05-14 (×2): qty 1

## 2015-05-14 MED ORDER — METOPROLOL TARTRATE 1 MG/ML IV SOLN
INTRAVENOUS | Status: AC
  Administered 2015-05-14: 1 mg
  Filled 2015-05-14: qty 5

## 2015-05-14 MED ORDER — PROPOFOL 1000 MG/100ML IV EMUL
INTRAVENOUS | Status: AC
  Administered 2015-05-14: 20 ug/kg/min via INTRAVENOUS
  Filled 2015-05-14: qty 100

## 2015-05-14 MED ORDER — METOPROLOL TARTRATE 1 MG/ML IV SOLN
5.0000 mg | INTRAVENOUS | Status: DC | PRN
Start: 2015-05-14 — End: 2015-05-19
  Administered 2015-05-14 – 2015-05-15 (×2): 5 mg via INTRAVENOUS
  Filled 2015-05-14 (×2): qty 5

## 2015-05-14 MED ORDER — PROPOFOL 1000 MG/100ML IV EMUL
INTRAVENOUS | Status: AC
  Filled 2015-05-14: qty 100

## 2015-05-14 MED ORDER — VITAL HIGH PROTEIN PO LIQD
1000.0000 mL | ORAL | Status: DC
  Administered 2015-05-14 – 2015-05-15 (×2): 1000 mL

## 2015-05-14 NOTE — Clinical Social Work Note (Signed)
Patient remains on ventilator at this time and has failed extubation attempts. CSW continuing to follow. If patient is unable to be weaned from ventilator and receives trach and PEG, patient may need to be considered for vent SNF. Patient is medicaid only and therefore cannot be considered for LTAC. York SpanielMonica Jakiyah Stepney MSW,LCSW [phone]

## 2015-05-14 NOTE — Progress Notes (Signed)
Patient ID: Noni SaupeRobin Gannett, female   DOB: 1976-02-22, 39 y.o.   MRN: [id number] [facility]  PCP: No PCP Per Patient  HPI/Subjective: Patient sedated.  Objective: Filed Vitals:   05/14/15 1200  BP: 97/55  Pulse: 99  Temp:   Resp: 22    Filed Weights   05/12/15 0434 05/13/15 0425 05/14/15 0500  Weight: 74.5 kg (164 lb 3.9 oz) 74.6 kg (164 lb 7.4 oz) 74.8 kg (164 lb 14.5 oz)    ROS: Review of Systems  Unable to perform ROS  secondary to being on the ventilator and on sedation Exam: Physical Exam  Constitutional: She appears lethargic.  HENT:  Nose: No mucosal edema.  Unable to look in mouth.  Eyes: Conjunctivae and lids are normal. Pupils are equal, round, and reactive to light.  Neck: No JVD present. Carotid bruit is not present. No edema present. No thyroid mass and no thyromegaly present.  Cardiovascular: S1 normal and S2 normal.  Exam reveals no gallop.   No murmur heard. Pulses:      Dorsalis pedis pulses are 2+ on the right side, and 2+ on the left side.  Respiratory: No respiratory distress. She has no wheezes. She has no rhonchi. She has rales in the right lower field and the left lower field.  GI: Soft. Bowel sounds are normal. There is no tenderness.  Musculoskeletal:       Right ankle: She exhibits swelling.       Left ankle: She exhibits swelling.  Lymphadenopathy:    She has no cervical adenopathy.  Neurological: She appears lethargic.  Skin: Skin is warm. No rash noted. Nails show no clubbing.  Psychiatric:  No response today sternal rub.    Data Reviewed: Basic Metabolic Panel:  Recent Labs Lab 05/08/15 0508  05/10/15 1024 05/11/15 0500 05/12/15 0359 05/13/15 0456 05/14/15 0414  NA  --   < > 141 146* 147* 153* 148*  K  --   < > 3.4* 3.2* 3.6 3.5 3.2*  CL  --   < > 111 113* 117* 115* 111  CO2  --   < > 29 27 25 31  32  GLUCOSE  --   < > 73 87 106* 120* 114*  BUN  --   < > 15 15 17  23* 19  CREATININE  --   < > 0.65  0.88 0.75 0.64 0.48  CALCIUM  --   < > 7.2* 7.8* 7.8* 8.4* 8.2*  MG 2.0  --   --   --   --   --   --   < > = values in this interval not displayed. Liver Function Tests:  Recent Labs Lab 05/10/15 1024 05/11/15 0500 05/12/15 0359 05/13/15 0456 05/14/15 0414  AST 764* 1247* 649* 186* 84*  ALT 1099* 1895* 1760* 1172* 710*  ALKPHOS 63 82 102 100 109  BILITOT 1.0 1.1 0.6 0.7 0.6  PROT 5.2* 5.4* 5.6* 6.0* 5.4*  ALBUMIN 2.6* 2.5* 2.3* 2.2* 1.9*    CBC:  Recent Labs Lab 05/08/15 0508  05/10/15 0510 05/11/15 0500 05/12/15 0359 05/13/15 0456 05/14/15 0414  WBC 19.5*  < > 9.7 12.8* 17.0* 19.6* 17.1*  NEUTROABS 17.6*  --   --   --   --   --   --   HGB 10.7*  < > 9.0* 8.4* 8.3* 8.3* 8.1*  HCT 32.9*  < > 26.8* 26.2* 25.6* 25.2* 24.7*  MCV 88.4  < > 89.2 89.0 90.0 89.2  90.1  PLT 212  < > 142* 147* 140* 139* 163  < > = values in this interval not displayed. Cardiac Enzymes:  Recent Labs Lab 05/09/15 0917 05/09/15 1741 05/10/15 0510 05/11/15 0500 05/13/15 0456  CKTOTAL 40981* 15266* 14052* 5905* 857*      Recent Results (from the past 240 hour(s))  MRSA PCR Screening     Status: None   Collection Time: 05/04/2015 11:05 PM  Result Value Ref Range Status   MRSA by PCR NEGATIVE NEGATIVE Final    Comment:        The GeneXpert MRSA Assay (FDA approved for NASAL specimens only), is one component of a comprehensive MRSA colonization surveillance program. It is not intended to diagnose MRSA infection nor to guide or monitor treatment for MRSA infections.   Urine culture     Status: None   Collection Time: 05/07/15  8:32 AM  Result Value Ref Range Status   Specimen Description URINE, CATHETERIZED  Final   Special Requests Normal  Final   Culture NO GROWTH 2 DAYS  Final   Report Status 05/09/2015 FINAL  Final  Culture, blood (routine x 2)     Status: None   Collection Time: 05/07/15  8:43 AM  Result Value Ref Range Status   Specimen Description BLOOD RIGHT AC  Final    Special Requests BOTTLES DRAWN AEROBIC AND ANAEROBIC  7CC  Final   Culture NO GROWTH 5 DAYS  Final   Report Status 05/12/2015 FINAL  Final  Culture, blood (routine x 2)     Status: None   Collection Time: 05/07/15  8:55 AM  Result Value Ref Range Status   Specimen Description BLOOD RIGHT HAND  Final   Special Requests   Final    BOTTLES DRAWN AEROBIC AND ANAEROBIC  AER 7CC ANA 10CC   Culture NO GROWTH 5 DAYS  Final   Report Status 05/12/2015 FINAL  Final  Culture, respiratory (NON-Expectorated)     Status: None   Collection Time: 05/09/15  3:47 PM  Result Value Ref Range Status   Specimen Description TRACHEAL ASPIRATE  Final   Special Requests NONE  Final   Gram Stain   Final    EXCELLENT SPECIMEN - 90-100% WBCS MODERATE WBC SEEN RARE GRAM POSITIVE COCCI    Culture APPEARS TO BE NORMAL RESPIRATORY FLORA  Final   Report Status 05/11/2015 FINAL  Final  Culture, blood (routine x 2)     Status: None (Preliminary result)   Collection Time: 05/13/15 12:46 PM  Result Value Ref Range Status   Specimen Description BLOOD RIGHT AC  Final   Special Requests BOTTLES DRAWN AEROBIC AND ANAEROBIC  15CC  Final   Culture NO GROWTH 1 DAY  Final   Report Status PENDING  Incomplete  Culture, blood (routine x 2)     Status: None (Preliminary result)   Collection Time: 05/13/15 12:57 PM  Result Value Ref Range Status   Specimen Description BLOOD RIGHT WRIST  Final   Special Requests   Final    BOTTLES DRAWN AEROBIC AND ANAEROBIC  AER 15CC ANA 10CC   Culture NO GROWTH 1 DAY  Final   Report Status PENDING  Incomplete  Urine culture     Status: None (Preliminary result)   Collection Time: 05/13/15  2:52 PM  Result Value Ref Range Status   Specimen Description URINE, RANDOM  Final   Special Requests NONE  Final   Culture NO GROWTH < 24 HOURS  Final  Report Status PENDING  Incomplete  Culture, respiratory (NON-Expectorated)     Status: None (Preliminary result)   Collection Time: 05/13/15  3:16  PM  Result Value Ref Range Status   Specimen Description TRACHEAL ASPIRATE  Final   Special Requests NONE  Final   Gram Stain   Final    MODERATE WBC SEEN NO ORGANISMS SEEN GOOD SPECIMEN - 80-90% WBCS    Culture NO GROWTH < 24 HOURS  Final   Report Status PENDING  Incomplete     Studies: Dg Chest Port 1 View  05/13/2015  CLINICAL DATA:  Respiratory failure.  Pulmonary infiltrates. EXAM: PORTABLE CHEST 1 VIEW COMPARISON:  05/12/2015 and 05/11/2015 and 05/08/2015 FINDINGS: Endotracheal, NG tube and central line all appear in good position. Bilateral pulmonary infiltrates have improved. No effusions. Heart size and vascularity are normal. IMPRESSION: Interval improvement in the bilateral pulmonary infiltrates. Electronically Signed   By: Francene Boyers M.D.   On: 05/13/2015 07:37   Dg Chest Port 1 View  05/12/2015  CLINICAL DATA:  Hypoxemia on ventilator EXAM: PORTABLE CHEST 1 VIEW COMPARISON:  May 12, 2015 FINDINGS: The heart size and mediastinal contours are stable. Endotracheal tube is identified with distal tip 5.3 cm from carina. Nasogastric tube is identified distal tip not included. Right central venous line is identified distal tip probably in the right atrial superior vena cava junction or right atrium unchanged. There is severe pulmonary edema. Patchy consolidation of bilateral lung bases are noted. The visualized skeletal structures are stable. IMPRESSION: Severe pulmonary edema much worse compared to previous earlier exam. Electronically Signed   By: Sherian Rein M.D.   On: 05/12/2015 18:41    Scheduled Meds: . antiseptic oral rinse  7 mL Mouth Rinse QID  . cefTAZidime (FORTAZ)  IV  2 g Intravenous 3 times per day  . chlorhexidine gluconate  15 mL Mouth Rinse BID  . enoxaparin (LOVENOX) injection  40 mg Subcutaneous Q24H  . feeding supplement (PRO-STAT SUGAR FREE 64)  30 mL Oral BID  . free water  200 mL Per Tube Q4H  . levETIRAcetam  500 mg Intravenous Q12H  .  pantoprazole sodium  40 mg Per Tube Q1200  . potassium chloride  40 mEq Per Tube BID   Continuous Infusions: . dexmedetomidine 1.8 mcg/kg/hr (05/14/15 0728)  . dextrose 75 mL/hr at 05/14/15 0617  . feeding supplement (VITAL 1.5 CAL) 1,000 mL (05/13/15 1813)  . fentaNYL infusion INTRAVENOUS 225 mcg/hr (05/14/15 1610)    Assessment/Plan:  1. 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 Metabolic encephalopathy. Patient is on Keppra and neurology is following. Last EEG showed diffuse slowing but no active seizure. Repeat CT scan.  2. Ventricular tachycardia cardiac arrest and lactic acidosis 3. Aspiration pneumonia, high fever- ceftazidime. Pan cultures on 05/13/2015 are negative so far. 4. Acute respiratory failure with hypoxia- patient required reintubation. Full vent support at this time, now requiring 75% oxygen. As per the nursing staff, patient's had some mucous plugging. 5. Acute systolic congestive heart failure and fluid overload- patient receiving a lot of fluids with sedation and antibiotics. Will need intermittent Lasix for diuresis. 6. Hypokalemia- replace via tube 7. Anemia- hemoglobin now trending down to 8.1. 7.   Rhabdomyolysis- patient's CPK is trending better. Likely secondary to status epilepticus. 8.   Shock liver- ALT still elevated above 700. AST has come to 84 9. Hypernatremia- critical care specialist started on D5W, I will decrease the rate with the fluid overload. 11. Anxiety depression and suicide attempt-  as per children she does not want to live. Will need psychiatric evaluation once extubated.   Code Status:     Code Status Orders        Start     Ordered   06/03/2015 2311  Full code   Continuous     Jun 03, 2015 2310     Disposition Plan: To be determined  Consultants:  Critical care specialist  Neurology  Antibiotics:  Vancomycin  Ceftazidime  Time spent: 32 minutes, patient is critically ill and high risk for cardiopulmonary arrest.    Alford Highland  [facility]

## 2015-05-14 NOTE — Progress Notes (Signed)
Pt profile: 89 F with multiple previous suicide attempts took large OD of bupropion 10/07 c/b acute resp failure, protracted seizures, shock. Improved 10/10 and able to F/C intermittently. Failed extubation 10/10 with pulm edema pattern on CXR after re-intubation  Active problems: VDRF Pulm edema Possible aspiration Failed extubation 10/10 Cardiomyopathy (LVEF 40%) Shock, resolved Hypokalemia Acute encephalopathy Seizures due to bupropion OD   Lines, Tubes, etc: ETT 10/07 >> 10/10, 10/10 >>  R femoral A-line 10/09 >> 10/12 R IJ CVL 10/09 >>    Microbiology: MRSA PCR 10/07 >> NEG Urine 10/08 >> NEG Resp 10/10 >> NOF Blood 10/08 >> NEG PCT 10/11: 4.32, 1.98,   Antibiotics:  Pip-tazo 10/09 >> 10/10 Vanc 10/09 >>  Ceftaz 10/11 >>   Studies/Events: 10/08 CT head: normal 10/09 TTE: LVEF 40-45% 10/10 Failed extubation. 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 10/12 Ct head: NAD  Consults:  PCCM 10/08 Neuro 10/08  Best Practice: DVT: LMWH SUP: PPI Nutrition: TFs 10/11 Glycemic control: no SSI indicated Sedation/analgesia: Propofol  Subj: RASS --2 to +2, + F/C intermittent  Obj: Filed Vitals:   05/14/15 0900  BP: 109/64  Pulse: 93  Temp:   Resp: 18    Gen: WDWN, RASS -1, intubated HEENT: NCAT, EOMI, PERRL Neck: No JVD Chest: diffuse rhonchi Cardiac: reg, no M Abd: soft, NT Ext: warm, no edema Neuro: MAEs vigorously to stimulation  BMET BMP Latest Ref Rng 05/14/2015 05/13/2015 05/12/2015  Glucose 65 - 99 mg/dL 956(O114(H) 130(Q120(H) 657(Q106(H)  BUN 6 - 20 mg/dL 19 46(N23(H) 17  Creatinine 0.44 - 1.00 mg/dL 6.290.48 5.280.64 4.130.75  Sodium 135 - 145 mmol/L 148(H) 153(H) 147(H)  Potassium 3.5 - 5.1 mmol/L 3.2(L) 3.5 3.6  Chloride 101 - 111 mmol/L 111 115(H) 117(H)  CO2 22 - 32 mmol/L 32 31 25  Calcium 8.9 - 10.3 mg/dL 8.2(L) 8.4(L) 7.8(L)     CBC CBC Latest Ref Rng  05/14/2015 05/13/2015 05/12/2015  WBC 3.6 - 11.0 K/uL 17.1(H) 19.6(H) 17.0(H)  Hemoglobin 12.0 - 16.0 g/dL 8.1(L) 8.3(L) 8.3(L)  Hematocrit 35.0 - 47.0 % 24.7(L) 25.2(L) 25.6(L)  Platelets 150 - 440 K/uL 163 139(L) 140(L)      CXR: slightly improved edema/ALI pattern   IMPRESSION: VDRF Failed extubation 10/10 pulm edema Likely aspiration Acute lung injury/ARDS - improving Cardiomyopathy - likely toxic Hypokalemia Hypernatremia Rhabdomyolysis - CK improving Lactic acidosis, resolved Elevated LFTs - improving Severe TME - improving Agitated delirium  PLAN/RECS:  Cont vent support - settings reviewed and/or adjusted Cont vent bundle Daily SBT if/when meets criteria MAP goal > 60 mmHg Monitor BMET intermittently Monitor I/Os Correct electrolytes as indicated Cont SUP and DVT px Cont TFs Cont anticonvulsants per Neuro Will change sedation to propofol.   Wells Guileseep Chey Cho, M.D.  PCCM service Pager [phone]

## 2015-05-14 NOTE — Progress Notes (Signed)
Nutrition Follow-up     INTERVENTION: EN: Recommend changing tube feeding of vital 1.5 to vital high protein secondary to additional kcals from D5 and diprivan. Vital high protein at goal rate of 3549ml/hr will provide 1180 kcals, 103 gm of protein and 987ml free water (additional kcals from diprivan 359 kcals titrating up, 306 kcals from D5). Recommend discontinuing prostat at this time. Discussed change in tube feeding with RN, Adam and Dr Renae GlossWieting.   NUTRITION DIAGNOSIS:   Inadequate oral intake related to acute illness as evidenced by NPO status.    GOAL:   Provide needs based on ASPEN/SCCM guidelines    MONITOR:    (Energy Intake, Anthropometrics, Electrolyte/Renal Profile, Glucose Profile, Digestive System)  REASON FOR ASSESSMENT:   Ventilator    ASSESSMENT:      Pt remains on vent   Current Nutrition: tolerating vital 1.5 at 3245ml/hr and prostat BID   Gastrointestinal Profile: Last BM: rectal tube in place with 100ml output noted   Medications: D5 at 2475ml/hr, lactulose stopped, diprivan 13.486ml/hr (providing 359 kcals in next 24 hr)  Electrolyte/Renal Profile and Glucose Profile:   Recent Labs Lab 05/08/15 0508  05/12/15 0359 05/13/15 0456 05/14/15 0414  NA  --   < > 147* 153* 148*  K  --   < > 3.6 3.5 3.2*  CL  --   < > 117* 115* 111  CO2  --   < > 25 31 32  BUN  --   < > 17 23* 19  CREATININE  --   < > 0.75 0.64 0.48  CALCIUM  --   < > 7.8* 8.4* 8.2*  MG 2.0  --   --   --   --   GLUCOSE  --   < > 106* 120* 114*  < > = values in this interval not displayed. Protein Profile:  Recent Labs Lab 05/12/15 0359 05/13/15 0456 05/14/15 0414  ALBUMIN 2.3* 2.2* 1.9*        Weight Trend since Admission: Filed Weights   05/12/15 0434 05/13/15 0425 05/14/15 0500  Weight: 164 lb 3.9 oz (74.5 kg) 164 lb 7.4 oz (74.6 kg) 164 lb 14.5 oz (74.8 kg)      Diet Order:   NPO  Skin:   reviewed   Height:   Ht Readings from Last 1 Encounters:   05/17/2015 5\' 5"  (1.651 m)    Weight:   Wt Readings from Last 1 Encounters:  05/14/15 164 lb 14.5 oz (74.8 kg)    Ideal Body Weight:     BMI:  Body mass index is 27.44 kg/(m^2).  Estimated Nutritional Needs:   Kcal:  1846 kcals (BEE: 1466, Ve: 12, Tmax: 37.8) using current wt of 74.5  Protein:  89-149 g (1.2-2.0 g/kg)   Fluid:  1875-2250 mL (25-30 ml/kg)   EDUCATION NEEDS:   No education needs identified at this time  HIGH Care Level  Anaisabel Pederson B. Freida BusmanAllen, RD, LDN [phone] (pager)

## 2015-05-14 NOTE — Progress Notes (Signed)
Pt's O2 dropped into the low 80's during suctioning. Pt was preoxygenated on 100% and presuction O2 sat was 96%.  Pt was unable to recover from suctioning so pt was placed on 100% FiO2.

## 2015-05-14 NOTE — Progress Notes (Signed)
ANTIBIOTIC CONSULT NOTE - FOLLOW UP  Pharmacy Consult for Ceftazidime Indication: HCAP/?aspiration PNA  No Known Allergies  Patient Measurements: Height: 5\' 5"  (165.1 cm) Weight: 164 lb 14.5 oz (74.8 kg) IBW/kg (Calculated) : 57  Vital Signs: Temp: 100.2 F (37.9 C) (10/15 0600) BP: 97/55 mmHg (10/15 1200) Pulse Rate: 99 (10/15 1200) Intake/Output from previous day: 10/14 0701 - 10/15 0700 In: 4284.3 [I.V.:2369.3; NG/GT:1710; IV Piggyback:205] Out: 3650 [Urine:3550; Stool:100] Intake/Output from this shift: Total I/O In: 255 [I.V.:150; IV Piggyback:105] Out: 475 [Urine:475]  Labs:  Recent Labs  05/12/15 0359 05/13/15 0456 05/14/15 0414  WBC 17.0* 19.6* 17.1*  HGB 8.3* 8.3* 8.1*  PLT 140* 139* 163  CREATININE 0.75 0.64 0.48   Estimated Creatinine Clearance: 97.4 mL/min (by C-G formula based on Cr of 0.48).  Recent Labs  05/12/15 0811  [facility]     Microbiology: Recent Results (from the past 720 hour(s))  MRSA PCR Screening     Status: None   Collection Time: 05/18/2015 11:05 PM  Result Value Ref Range Status   MRSA by PCR NEGATIVE NEGATIVE Final    Comment:        The GeneXpert MRSA Assay (FDA approved for NASAL specimens only), is one component of a comprehensive MRSA colonization surveillance program. It is not intended to diagnose MRSA infection nor to guide or monitor treatment for MRSA infections.   Urine culture     Status: None   Collection Time: 05/07/15  8:32 AM  Result Value Ref Range Status   Specimen Description URINE, CATHETERIZED  Final   Special Requests Normal  Final   Culture NO GROWTH 2 DAYS  Final   Report Status 05/09/2015 FINAL  Final  Culture, blood (routine x 2)     Status: None   Collection Time: 05/07/15  8:43 AM  Result Value Ref Range Status   Specimen Description BLOOD RIGHT AC  Final   Special Requests BOTTLES DRAWN AEROBIC AND ANAEROBIC  7CC  Final   Culture NO GROWTH 5 DAYS  Final   Report Status 05/12/2015  FINAL  Final  Culture, blood (routine x 2)     Status: None   Collection Time: 05/07/15  8:55 AM  Result Value Ref Range Status   Specimen Description BLOOD RIGHT HAND  Final   Special Requests   Final    BOTTLES DRAWN AEROBIC AND ANAEROBIC  AER 7CC ANA 10CC   Culture NO GROWTH 5 DAYS  Final   Report Status 05/12/2015 FINAL  Final  Culture, respiratory (NON-Expectorated)     Status: None   Collection Time: 05/09/15  3:47 PM  Result Value Ref Range Status   Specimen Description TRACHEAL ASPIRATE  Final   Special Requests NONE  Final   Gram Stain   Final    EXCELLENT SPECIMEN - 90-100% WBCS MODERATE WBC SEEN RARE GRAM POSITIVE COCCI    Culture APPEARS TO BE NORMAL RESPIRATORY FLORA  Final   Report Status 05/11/2015 FINAL  Final  Culture, blood (routine x 2)     Status: None (Preliminary result)   Collection Time: 05/13/15 12:46 PM  Result Value Ref Range Status   Specimen Description BLOOD RIGHT AC  Final   Special Requests BOTTLES DRAWN AEROBIC AND ANAEROBIC  15CC  Final   Culture NO GROWTH 1 DAY  Final   Report Status PENDING  Incomplete  Culture, blood (routine x 2)     Status: None (Preliminary result)   Collection Time: 05/13/15 12:57 PM  Result  Value Ref Range Status   Specimen Description BLOOD RIGHT WRIST  Final   Special Requests   Final    BOTTLES DRAWN AEROBIC AND ANAEROBIC  AER 15CC ANA 10CC   Culture NO GROWTH 1 DAY  Final   Report Status PENDING  Incomplete  Urine culture     Status: None (Preliminary result)   Collection Time: 05/13/15  2:52 PM  Result Value Ref Range Status   Specimen Description URINE, RANDOM  Final   Special Requests NONE  Final   Culture NO GROWTH < 24 HOURS  Final   Report Status PENDING  Incomplete  Culture, respiratory (NON-Expectorated)     Status: None (Preliminary result)   Collection Time: 05/13/15  3:16 PM  Result Value Ref Range Status   Specimen Description TRACHEAL ASPIRATE  Final   Special Requests NONE  Final   Gram Stain    Final    MODERATE WBC SEEN NO ORGANISMS SEEN GOOD SPECIMEN - 80-90% WBCS    Culture NO GROWTH < 24 HOURS  Final   Report Status PENDING  Incomplete    Anti-infectives    Start     Dose/Rate Route Frequency Ordered Stop   05/10/15 2000  vancomycin (VANCOCIN) 1,250 mg in sodium chloride 0.9 % 250 mL IVPB  Status:  Discontinued     1,250 mg 166.7 mL/hr over 90 Minutes Intravenous Every 12 hours 05/10/15 1103 05/12/15 0959   05/10/15 1200  vancomycin (VANCOCIN) 1,250 mg in sodium chloride 0.9 % 250 mL IVPB     1,250 mg 166.7 mL/hr over 90 Minutes Intravenous  Once 05/10/15 1103 05/10/15 1424   05/10/15 1115  cefTAZidime (FORTAZ) 2 g in dextrose 5 % 50 mL IVPB     2 g 100 mL/hr over 30 Minutes Intravenous 3 times per day 05/10/15 1103     05/09/15 1300  vancomycin (VANCOCIN) IVPB 750 mg/150 ml premix  Status:  Discontinued     750 mg 150 mL/hr over 60 Minutes Intravenous Every 12 hours 05/09/15 0918 05/09/15 0952   05/09/15 0130  vancomycin (VANCOCIN) IVPB 750 mg/150 ml premix  Status:  Discontinued     750 mg 150 mL/hr over 60 Minutes Intravenous Every 8 hours 05/08/15 1644 05/09/15 0918   05/08/15 2345  piperacillin-tazobactam (ZOSYN) IVPB 3.375 g  Status:  Discontinued     3.375 g 12.5 mL/hr over 240 Minutes Intravenous 3 times per day 05/08/15 2335 05/09/15 0952   05/08/15 1645  vancomycin (VANCOCIN) 1,750 mg in sodium chloride 0.9 % 500 mL IVPB     1,750 mg 250 mL/hr over 120 Minutes Intravenous  Once 05/08/15 1644 05/08/15 1933     Assessment: 39 yo female admitted for bupropion OD, acute respiratory failure and seizures. Patient failed extubation on 10/10. Pharmacy consulted on 10/11 for dosing and monitoring of Vancomycin and Ceftazidime for HCAP. Vancomycin discontinued 10/13, currently patient is receiving ceftazidime 2g every 8 hours.   Patient remains on full vent support.CXR today showing improvement in bilateral pulmonary infiltrates, Tmax 103.2 in last 24 hours, but  slowly been improving over past 12 hours. Blood cultures X2 no NG x5 days respiratory culture showing normal respiratory flora. O2 sat improved over last 24 hours, CrCl 97.3 , Scr 0.64, WBC 19.6 (up from 17).    Plan:  Will continue Ceftazidime 2gm every 8 hours. Pharmacy will continue to monitor renal function and labs and make adjustments as needed.   Bari Mantis PharmD Clinical Pharmacist 05/14/2015 2:19 PM

## 2015-05-15 ENCOUNTER — Inpatient Hospital Stay: Payer: Medicaid Other

## 2015-05-15 LAB — GLUCOSE, CAPILLARY
GLUCOSE-CAPILLARY: 103 mg/dL — AB (ref 65–99)
GLUCOSE-CAPILLARY: 99 mg/dL (ref 65–99)
Glucose-Capillary: 90 mg/dL (ref 65–99)

## 2015-05-15 LAB — BASIC METABOLIC PANEL
ANION GAP: 8 (ref 5–15)
BUN: 15 mg/dL (ref 6–20)
CHLORIDE: 104 mmol/L (ref 101–111)
CO2: 37 mmol/L — ABNORMAL HIGH (ref 22–32)
CREATININE: 0.46 mg/dL (ref 0.44–1.00)
Calcium: 8.2 mg/dL — ABNORMAL LOW (ref 8.9–10.3)
GFR calc non Af Amer: >60 mL/min (ref >60)
Glucose, Bld: 116 mg/dL — ABNORMAL HIGH (ref 65–99)
Potassium: 3.4 mmol/L — ABNORMAL LOW (ref 3.5–5.1)
SODIUM: 149 mmol/L — AB (ref 135–145)

## 2015-05-15 LAB — BLOOD GAS, ARTERIAL
ACID-BASE EXCESS: 12.5 mmol/L — AB (ref 0.0–3.0)
BICARBONATE: 40.3 meq/L — AB (ref 21.0–28.0)
FIO2: 0.9
Mechanical Rate: 18
O2 Saturation: 99.1 %
PEEP: 10 cmH2O
PRESSURE CONTROL: 18 cmH2O
Patient temperature: 37.7
pCO2 arterial: 67 mmHg — ABNORMAL HIGH (ref 32.0–48.0)
pH, Arterial: 7.39 (ref 7.350–7.450)
pO2, Arterial: 141 mmHg — ABNORMAL HIGH (ref 83.0–108.0)

## 2015-05-15 LAB — CULTURE, RESPIRATORY

## 2015-05-15 LAB — URINE CULTURE: Culture: NO GROWTH

## 2015-05-15 LAB — CBC
HCT: 26.2 % — ABNORMAL LOW (ref 35.0–47.0)
HEMOGLOBIN: 8.8 g/dL — AB (ref 12.0–16.0)
MCH: 29.7 pg (ref 26.0–34.0)
MCHC: 33.5 g/dL (ref 32.0–36.0)
MCV: 88.6 fL (ref 80.0–100.0)
Platelets: 191 10*3/uL (ref 150–440)
RBC: 2.96 MIL/uL — AB (ref 3.80–5.20)
RDW: 14.4 % (ref 11.5–14.5)
WBC: 22.7 10*3/uL — AB (ref 3.6–11.0)

## 2015-05-15 LAB — CULTURE, RESPIRATORY W GRAM STAIN: Culture: NO GROWTH

## 2015-05-15 LAB — CK: Total CK: 76 U/L (ref 38–234)

## 2015-05-15 MED ORDER — FUROSEMIDE 10 MG/ML IJ SOLN
40.0000 mg | Freq: Once | INTRAMUSCULAR | Status: AC
  Administered 2015-05-15: 40 mg via INTRAVENOUS
  Filled 2015-05-15: qty 4

## 2015-05-15 MED ORDER — VANCOMYCIN HCL IN DEXTROSE 1-5 GM/200ML-% IV SOLN
1000.0000 mg | Freq: Two times a day (BID) | INTRAVENOUS | Status: DC
  Administered 2015-05-15 – 2015-05-17 (×4): 1000 mg via INTRAVENOUS
  Filled 2015-05-15 (×5): qty 200

## 2015-05-15 MED ORDER — VANCOMYCIN HCL IN DEXTROSE 1-5 GM/200ML-% IV SOLN
1000.0000 mg | Freq: Once | INTRAVENOUS | Status: AC
  Administered 2015-05-15: 1000 mg via INTRAVENOUS
  Filled 2015-05-15: qty 200

## 2015-05-15 MED ORDER — ACETAMINOPHEN 160 MG/5ML PO SOLN
650.0000 mg | Freq: Four times a day (QID) | ORAL | Status: DC
  Administered 2015-05-15 – 2015-05-19 (×17): 650 mg
  Filled 2015-05-15 (×15): qty 20.3

## 2015-05-15 NOTE — Progress Notes (Addendum)
ANTIBIOTIC CONSULT NOTE - FOLLOW UP  Pharmacy Consult for Ceftazidime/Vancomycin Indication: HCAP/?aspiration PNA  No Known Allergies  Patient Measurements: Height: 5\' 5"  (165.1 cm) Weight: 169 lb 15.6 oz (77.1 kg) IBW/kg (Calculated) : 57  Vital Signs: Temp: 100.6 F (38.1 C) (10/16 0800) BP: 116/73 mmHg (10/16 1000) Pulse Rate: 115 (10/16 1000) Intake/Output from previous day: 10/15 0701 - 10/16 0700 In: 1055.6 [I.V.:400.6; NG/GT:500; IV Piggyback:155] Out: 1675 [Urine:1675] Intake/Output from this shift: Total I/O In: -  Out: 250 [Urine:250]  Labs:  Recent Labs  05/13/15 0456 05/14/15 0414 05/15/15 0510  WBC 19.6* 17.1* 22.7*  HGB 8.3* 8.1* 8.8*  PLT 139* 163 191  CREATININE 0.64 0.48 0.46   Estimated Creatinine Clearance: 98.8 mL/min (by C-G formula based on Cr of 0.46).  Microbiology: Recent Results (from the past 720 hour(s))  MRSA PCR Screening     Status: None   Collection Time: September 23, 2014 11:05 PM  Result Value Ref Range Status   MRSA by PCR NEGATIVE NEGATIVE Final    Comment:        The GeneXpert MRSA Assay (FDA approved for NASAL specimens only), is one component of a comprehensive MRSA colonization surveillance program. It is not intended to diagnose MRSA infection nor to guide or monitor treatment for MRSA infections.   Urine culture     Status: None   Collection Time: 05/07/15  8:32 AM  Result Value Ref Range Status   Specimen Description URINE, CATHETERIZED  Final   Special Requests Normal  Final   Culture NO GROWTH 2 DAYS  Final   Report Status 05/09/2015 FINAL  Final  Culture, blood (routine x 2)     Status: None   Collection Time: 05/07/15  8:43 AM  Result Value Ref Range Status   Specimen Description BLOOD RIGHT AC  Final   Special Requests BOTTLES DRAWN AEROBIC AND ANAEROBIC  7CC  Final   Culture NO GROWTH 5 DAYS  Final   Report Status 05/12/2015 FINAL  Final  Culture, blood (routine x 2)     Status: None   Collection Time:  05/07/15  8:55 AM  Result Value Ref Range Status   Specimen Description BLOOD RIGHT HAND  Final   Special Requests   Final    BOTTLES DRAWN AEROBIC AND ANAEROBIC  AER 7CC ANA 10CC   Culture NO GROWTH 5 DAYS  Final   Report Status 05/12/2015 FINAL  Final  Culture, respiratory (NON-Expectorated)     Status: None   Collection Time: 05/09/15  3:47 PM  Result Value Ref Range Status   Specimen Description TRACHEAL ASPIRATE  Final   Special Requests NONE  Final   Gram Stain   Final    EXCELLENT SPECIMEN - 90-100% WBCS MODERATE WBC SEEN RARE GRAM POSITIVE COCCI    Culture APPEARS TO BE NORMAL RESPIRATORY FLORA  Final   Report Status 05/11/2015 FINAL  Final  Culture, blood (routine x 2)     Status: None (Preliminary result)   Collection Time: 05/13/15 12:46 PM  Result Value Ref Range Status   Specimen Description BLOOD RIGHT AC  Final   Special Requests BOTTLES DRAWN AEROBIC AND ANAEROBIC  15CC  Final   Culture NO GROWTH 1 DAY  Final   Report Status PENDING  Incomplete  Culture, blood (routine x 2)     Status: None (Preliminary result)   Collection Time: 05/13/15 12:57 PM  Result Value Ref Range Status   Specimen Description BLOOD RIGHT WRIST  Final  Special Requests   Final    BOTTLES DRAWN AEROBIC AND ANAEROBIC  AER 15CC ANA 10CC   Culture NO GROWTH 1 DAY  Final   Report Status PENDING  Incomplete  Urine culture     Status: None   Collection Time: 05/13/15  2:52 PM  Result Value Ref Range Status   Specimen Description URINE, RANDOM  Final   Special Requests NONE  Final   Culture NO GROWTH 2 DAYS  Final   Report Status 05/15/2015 FINAL  Final  Culture, respiratory (NON-Expectorated)     Status: None   Collection Time: 05/13/15  3:16 PM  Result Value Ref Range Status   Specimen Description TRACHEAL ASPIRATE  Final   Special Requests NONE  Final   Gram Stain   Final    MODERATE WBC SEEN NO ORGANISMS SEEN GOOD SPECIMEN - 80-90% WBCS    Culture NO GROWTH 2 DAYS  Final    Report Status 05/15/2015 FINAL  Final  Urine culture     Status: None (Preliminary result)   Collection Time: 05/14/15 10:45 PM  Result Value Ref Range Status   Specimen Description URINE, RANDOM  Final   Special Requests NONE  Final   Culture NO GROWTH < 12 HOURS  Final   Report Status PENDING  Incomplete  Stool culture     Status: None (Preliminary result)   Collection Time: 05/14/15 10:45 PM  Result Value Ref Range Status   Specimen Description STOOL  Final   Special Requests NONE  Final   Culture TOO YOUNG TO READ NO CAMPYLOBACTER DETECTED   Final   Report Status PENDING  Incomplete    Anti-infectives    Start     Dose/Rate Route Frequency Ordered Stop   05/15/15 2200  vancomycin (VANCOCIN) IVPB 1000 mg/200 mL premix     1,000 mg 200 mL/hr over 60 Minutes Intravenous Every 12 hours 05/15/15 1435     05/15/15 1500  vancomycin (VANCOCIN) IVPB 1000 mg/200 mL premix     1,000 mg 200 mL/hr over 60 Minutes Intravenous  Once 05/15/15 1435     05/10/15 2000  vancomycin (VANCOCIN) 1,250 mg in sodium chloride 0.9 % 250 mL IVPB  Status:  Discontinued     1,250 mg 166.7 mL/hr over 90 Minutes Intravenous Every 12 hours 05/10/15 1103 05/12/15 0959   05/10/15 1200  vancomycin (VANCOCIN) 1,250 mg in sodium chloride 0.9 % 250 mL IVPB     1,250 mg 166.7 mL/hr over 90 Minutes Intravenous  Once 05/10/15 1103 05/10/15 1424   05/10/15 1115  cefTAZidime (FORTAZ) 2 g in dextrose 5 % 50 mL IVPB     2 g 100 mL/hr over 30 Minutes Intravenous 3 times per day 05/10/15 1103     05/09/15 1300  vancomycin (VANCOCIN) IVPB 750 mg/150 ml premix  Status:  Discontinued     750 mg 150 mL/hr over 60 Minutes Intravenous Every 12 hours 05/09/15 0918 05/09/15 0952   05/09/15 0130  vancomycin (VANCOCIN) IVPB 750 mg/150 ml premix  Status:  Discontinued     750 mg 150 mL/hr over 60 Minutes Intravenous Every 8 hours 05/08/15 1644 05/09/15 0918   05/08/15 2345  piperacillin-tazobactam (ZOSYN) IVPB 3.375 g  Status:   Discontinued     3.375 g 12.5 mL/hr over 240 Minutes Intravenous 3 times per day 05/08/15 2335 05/09/15 0952   05/08/15 1645  vancomycin (VANCOCIN) 1,750 mg in sodium chloride 0.9 % 500 mL IVPB     1,750 mg 250  mL/hr over 120 Minutes Intravenous  Once 05/08/15 1644 05/08/15 1933     Assessment: 39 yo female admitted for bupropion OD, acute respiratory failure and seizures. Patient failed extubation on 10/10. Pharmacy consulted on 10/11 for dosing and monitoring of Vancomycin and Ceftazidime for HCAP. Vancomycin discontinued 10/13, currently patient is receiving ceftazidime 2g every 8 hours.   Patient remains on full vent support.CXR today showing improvement in bilateral pulmonary infiltrates, Tmax 103.2 in last 24 hours, but slowly been improving over past 12 hours. Blood cultures X2 no NG x5 days respiratory culture showing normal respiratory flora. O2 sat improved over last 24 hours, CrCl 97.3 , Scr 0.64, WBC 19.6 (up from 17).    Plan:  Will continue Ceftazidime 2gm every 8 hours. Pharmacy will continue to monitor renal function and labs and make adjustments as needed.   10/16: Tmax 100.4. WBC 22.7 (not on steroids). Pulmonary MD note: restart Vancomycin 10/16. Paged MD to clarify as Vancomycin not ordered. Order to restart Vancomycin. Will order Vancomycin 1 gram IV x 1 to be followed in 7 hrs by Vancomycin 1 gram (stacked dosing) and then to continue Q12h.  Ke 0.076  T1/2 9.12  Vd 44.8    Will order Vancomycin trough prior to 5th dose on 10/18 at 0930.  Bari Mantis PharmD Clinical Pharmacist 05/15/2015 2:41 PM

## 2015-05-15 NOTE — Progress Notes (Signed)
Patient ID: Kathleen Gray, female   DOB: 1975/08/11, 39 y.o.   MRN: [id number] [facility] PROGRESS NOTE  PCP: No PCP Per Patient  HPI/Subjective: Patient sedated.  Objective: Filed Vitals:   05/15/15 1000  BP: 116/73  Pulse: 115  Temp:   Resp: 17    Filed Weights   05/13/15 0425 05/14/15 0500 05/15/15 0418  Weight: 74.6 kg (164 lb 7.4 oz) 74.8 kg (164 lb 14.5 oz) 77.1 kg (169 lb 15.6 oz)    ROS: Review of Systems  Unable to perform ROS  secondary to being on the ventilator and on sedation Exam: Physical Exam  Constitutional:  Patient is sedated.  HENT:  Nose: No mucosal edema.  Unable to look in mouth.  Eyes: Conjunctivae and lids are normal. Pupils are equal, round, and reactive to light.  Neck: No JVD present. Carotid bruit is not present. No edema present. No thyroid mass and no thyromegaly present.  Cardiovascular: S1 normal and S2 normal.  Exam reveals no gallop.   No murmur heard. Pulses:      Dorsalis pedis pulses are 2+ on the right side, and 2+ on the left side.  Respiratory: No respiratory distress. She has no wheezes. She has no rhonchi. She has rales in the right lower field and the left lower field.  GI: Soft. Bowel sounds are normal. There is no tenderness.  Musculoskeletal:       Right ankle: She exhibits swelling.       Left ankle: She exhibits swelling.  Lymphadenopathy:    She has no cervical adenopathy.  Skin: Skin is warm. No rash noted. Nails show no clubbing.  Psychiatric:  No response today sternal rub.    Data Reviewed: Basic Metabolic Panel:  Recent Labs Lab 05/11/15 0500 05/12/15 0359 05/13/15 0456 05/14/15 0414 05/15/15 0510  NA 146* 147* 153* 148* 149*  K 3.2* 3.6 3.5 3.2* 3.4*  CL 113* 117* 115* 111 104  CO2 32 37*  GLUCOSE 87 106* 120* 114* 116*  BUN 15 17 23* 19 15  CREATININE 0.88 0.75 0.64 0.48 0.46  CALCIUM 7.8* 7.8* 8.4* 8.2* 8.2*   Liver Function Tests:  Recent Labs Lab 05/10/15 1024  05/11/15 0500 05/12/15 0359 05/13/15 0456 05/14/15 0414  AST 764* 1247* 649* 186* 84*  ALT 1099* 1895* 1760* 1172* 710*  ALKPHOS 63 82 102 100 109  BILITOT 1.0 1.1 0.6 0.7 0.6  PROT 5.2* 5.4* 5.6* 6.0* 5.4*  ALBUMIN 2.6* 2.5* 2.3* 2.2* 1.9*    CBC:  Recent Labs Lab 05/11/15 0500 05/12/15 0359 05/13/15 0456 05/14/15 0414 05/15/15 0510  WBC 12.8* 17.0* 19.6* 17.1* 22.7*  HGB 8.4* 8.3* 8.3* 8.1* 8.8*  HCT 26.2* 25.6* 25.2* 24.7* 26.2*  MCV 89.0 90.0 89.2 90.1 88.6  PLT 147* 140* 139* 163 191   Cardiac Enzymes:  Recent Labs Lab 05/09/15 1741 05/10/15 0510 05/11/15 0500 05/13/15 0456 05/15/15 0510  CKTOTAL 40981* 14052* 5905* 857* 76    CBG:  Recent Labs Lab 05/14/15 0703 05/14/15 1113 05/14/15 1612 05/14/15 2337 05/15/15 0737  GLUCAP 103* 130* 83 86 103*    Recent Results (from the past 240 hour(s))  MRSA PCR Screening     Status: None   Collection Time: 05/05/2015 11:05 PM  Result Value Ref Range Status   MRSA by PCR NEGATIVE NEGATIVE Final    Comment:        The GeneXpert MRSA Assay (FDA approved for NASAL specimens only), is one component of  a comprehensive MRSA colonization surveillance program. It is not intended to diagnose MRSA infection nor to guide or monitor treatment for MRSA infections.   Urine culture     Status: None   Collection Time: 05/07/15  8:32 AM  Result Value Ref Range Status   Specimen Description URINE, CATHETERIZED  Final   Special Requests Normal  Final   Culture NO GROWTH 2 DAYS  Final   Report Status 05/09/2015 FINAL  Final  Culture, blood (routine x 2)     Status: None   Collection Time: 05/07/15  8:43 AM  Result Value Ref Range Status   Specimen Description BLOOD RIGHT AC  Final   Special Requests BOTTLES DRAWN AEROBIC AND ANAEROBIC  7CC  Final   Culture NO GROWTH 5 DAYS  Final   Report Status 05/12/2015 FINAL  Final  Culture, blood (routine x 2)     Status: None   Collection Time: 05/07/15  8:55 AM  Result  Value Ref Range Status   Specimen Description BLOOD RIGHT HAND  Final   Special Requests   Final    BOTTLES DRAWN AEROBIC AND ANAEROBIC  AER 7CC ANA 10CC   Culture NO GROWTH 5 DAYS  Final   Report Status 05/12/2015 FINAL  Final  Culture, respiratory (NON-Expectorated)     Status: None   Collection Time: 05/09/15  3:47 PM  Result Value Ref Range Status   Specimen Description TRACHEAL ASPIRATE  Final   Special Requests NONE  Final   Gram Stain   Final    EXCELLENT SPECIMEN - 90-100% WBCS MODERATE WBC SEEN RARE GRAM POSITIVE COCCI    Culture APPEARS TO BE NORMAL RESPIRATORY FLORA  Final   Report Status 05/11/2015 FINAL  Final  Culture, blood (routine x 2)     Status: None (Preliminary result)   Collection Time: 05/13/15 12:46 PM  Result Value Ref Range Status   Specimen Description BLOOD RIGHT AC  Final   Special Requests BOTTLES DRAWN AEROBIC AND ANAEROBIC  15CC  Final   Culture NO GROWTH 1 DAY  Final   Report Status PENDING  Incomplete  Culture, blood (routine x 2)     Status: None (Preliminary result)   Collection Time: 05/13/15 12:57 PM  Result Value Ref Range Status   Specimen Description BLOOD RIGHT WRIST  Final   Special Requests   Final    BOTTLES DRAWN AEROBIC AND ANAEROBIC  AER 15CC ANA 10CC   Culture NO GROWTH 1 DAY  Final   Report Status PENDING  Incomplete  Urine culture     Status: None   Collection Time: 05/13/15  2:52 PM  Result Value Ref Range Status   Specimen Description URINE, RANDOM  Final   Special Requests NONE  Final   Culture NO GROWTH 2 DAYS  Final   Report Status 05/15/2015 FINAL  Final  Culture, respiratory (NON-Expectorated)     Status: None   Collection Time: 05/13/15  3:16 PM  Result Value Ref Range Status   Specimen Description TRACHEAL ASPIRATE  Final   Special Requests NONE  Final   Gram Stain   Final    MODERATE WBC SEEN NO ORGANISMS SEEN GOOD SPECIMEN - 80-90% WBCS    Culture NO GROWTH 2 DAYS  Final   Report Status 05/15/2015 FINAL   Final  Urine culture     Status: None (Preliminary result)   Collection Time: 05/14/15 10:45 PM  Result Value Ref Range Status   Specimen Description URINE, RANDOM  Final   Special Requests NONE  Final   Culture NO GROWTH < 12 HOURS  Final   Report Status PENDING  Incomplete  Stool culture     Status: None (Preliminary result)   Collection Time: 05/14/15 10:45 PM  Result Value Ref Range Status   Specimen Description STOOL  Final   Special Requests NONE  Final   Culture TOO YOUNG TO READ NO CAMPYLOBACTER DETECTED   Final   Report Status PENDING  Incomplete     Studies: Dg Chest 1 View  05/15/2015  CLINICAL DATA:  Dyspnea EXAM: CHEST 1 VIEW COMPARISON:  05/13/2015 FINDINGS: Tubular device is stable. Extensive confluent bilateral airspace disease is worse. No pneumothorax. Normal heart size. IMPRESSION: Worsening confluent bilateral airspace disease. Electronically Signed   By: Jolaine Click M.D.   On: 05/15/2015 08:28    Scheduled Meds: . acetaminophen (TYLENOL) oral liquid 160 mg/5 mL  650 mg Per Tube 4 times per day  . antiseptic oral rinse  7 mL Mouth Rinse QID  . cefTAZidime (FORTAZ)  IV  2 g Intravenous 3 times per day  . chlorhexidine gluconate  15 mL Mouth Rinse BID  . enoxaparin (LOVENOX) injection  40 mg Subcutaneous Q24H  . free water  200 mL Per Tube Q4H  . furosemide  40 mg Intravenous Once  . levETIRAcetam  500 mg Intravenous Q12H  . pantoprazole sodium  40 mg Per Tube Q1200   Continuous Infusions: . dextrose 20 mL/hr at 05/14/15 1338  . feeding supplement (VITAL HIGH PROTEIN) 1,000 mL (05/15/15 0500)  . fentaNYL infusion INTRAVENOUS 200 mcg/hr (05/15/15 0500)  . propofol (DIPRIVAN) infusion 30.08 mcg/kg/min (05/15/15 0500)    Assessment/Plan:  1. 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 Metabolic encephalopathy. Patient is on Keppra and neurology is following. Last EEG showed diffuse slowing but no active seizure. Repeat CT scan.  2. Ventricular  tachycardia cardiac arrest and lactic acidosis 3. Aspiration pneumonia, high fever- on ceftazidime. Cultures on the 14th and 15th still negative to date. Send a stool for C. difficile. 4. Acute respiratory failure with hypoxia- patient required reintubation. Full vent support at this time, now requiring 90% oxygen. Could be ARDS on the chest x-ray. I will give a dose of Lasix in case this is fluid overload.  5. Acute systolic congestive heart failure and fluid overload- patient receiving a lot of fluids with sedation and antibiotics. Dose of IV Lasix today. 6. Hypokalemia- electrolyte protocol 7. Anemia- hemoglobin up at 8.8 today. 7.   Rhabdomyolysis- patient's CPK normalized. Likely secondary to status epilepticus. 8.   Shock liver- ALT still elevated above 1000. AST has come down 9.   Diarrhea- rectal tube in, lactulose stopped. Send stool for C. difficile. 10. Hypernatremia- free water via the tube. 11. Anxiety depression and suicide attempt.  Code Status:     Code Status Orders        Start     Ordered   06/02/15 2311  Full code   Continuous     06/02/2015 2310     Family Communication: Spoke with daughter on the phone. Disposition Plan: To be determined  Consultants:  Critical care specialist  Neurology  Antibiotics:  Vancomycin  Ceftazidime  Time spent: 32 minutes, patient is critically ill and high risk for cardiopulmonary arrest.   Alford Highland  [facility]

## 2015-05-15 NOTE — Progress Notes (Signed)
Pt profile: [age] F with multiple previous suicide attempts took large OD of bupropion 10/07 c/b acute resp failure, protracted seizures, shock. Improved 10/10 and able to F/C intermittently. Failed extubation 10/10 with pulm edema pattern on CXR after re-intubation  Active problems: VDRF--ARDS.  Pulm edema Possible aspiration Failed extubation 10/10 Cardiomyopathy (LVEF 40%) Shock, resolved Hypokalemia Acute encephalopathy Seizures due to bupropion OD   Lines, Tubes, etc: ETT 10/07 >> 10/10, 10/10 >>  R femoral A-line 10/09 >> 10/12 R IJ CVL 10/09 >>    Microbiology: MRSA PCR 10/07 >> NEG Urine 10/08 >> NEG Resp 10/10 >> NOF Blood 10/08 >> NEG PCT 10/11: 4.32, 1.98,   Antibiotics:  Pip-tazo 10/09 >> 10/10 Vanc 10/09 >> 10/13 >>Restart 10/16. Ceftaz 10/11 >>   Studies/Events: 10/08 CT head: normal 10/09 TTE: LVEF 40-45% 10/10 Failed extubation. 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 10/12 Ct head: NAD  Consults:  PCCM 10/08 Neuro 10/08  Best Practice: DVT: LMWH SUP: PPI Nutrition: TFs 10/11 Glycemic control: no SSI indicated Sedation/analgesia: Propofol  Subj: RASS --2 to +2, + F/C intermittent  Obj: Filed Vitals:   05/15/15 0700  BP: 124/68  Pulse: 114  Temp:   Resp: 19    Gen: WDWN, RASS -1, intubated HEENT: NCAT, EOMI, PERRL Neck: No JVD Chest: diffuse rhonchi Cardiac: reg, no M Abd: soft, NT Ext: warm, no edema Neuro: MAEs vigorously to stimulation  BMET BMP Latest Ref Rng 05/15/2015 05/14/2015 05/13/2015  Glucose 65 - 99 mg/dL 045(W116(H) 098(J114(H) 191(Y120(H)  BUN 6 - 20 mg/dL 15 19 78(G23(H)  Creatinine 0.44 - 1.00 mg/dL 9.560.46 2.130.48 0.860.64  Sodium 135 - 145 mmol/L 149(H) 148(H) 153(H)  Potassium 3.5 - 5.1 mmol/L 3.4(L) 3.2(L) 3.5  Chloride 101 - 111 mmol/L 104 111 115(H)  CO2 22 - 32 mmol/L 37(H) 32 31  Calcium 8.9 - 10.3 mg/dL 8.2(L) 8.2(L) 8.4(L)      CBC CBC Latest Ref Rng 05/15/2015 05/14/2015 05/13/2015  WBC 3.6 - 11.0 K/uL 22.7(H) 17.1(H) 19.6(H)  Hemoglobin 12.0 - 16.0 g/dL 5.7(Q8.8(L) 8.1(L) 8.3(L)  Hematocrit 35.0 - 47.0 % 26.2(L) 24.7(L) 25.2(L)  Platelets 150 - 440 K/uL 191 163 139(L)      CXR: slightly improved edema/ALI pattern   IMPRESSION: VDRF Failed extubation 10/10; possible ARDS, currently on PSV with PS of 18 and decreased to 17, Peep=10.  pulm edema Likely aspiration Acute lung injury/ARDS - improving Cardiomyopathy - likely toxic Hypokalemia Hypernatremia Rhabdomyolysis - CK improving Lactic acidosis, resolved Elevated LFTs - improving Severe TME - improving Agitated delirium  PLAN/RECS:  Cont vent support - settings reviewed and/or adjusted Cont vent bundle Daily SBT if/when meets criteria MAP goal > 60 mmHg Monitor BMET intermittently Monitor I/Os Correct electrolytes as indicated Cont SUP and DVT px Cont TFs Cont anticonvulsants per Neuro Will change sedation to propofol.   Wells Guileseep Joud Pettinato, M.D.  PCCM service Pager [phone]

## 2015-05-16 ENCOUNTER — Inpatient Hospital Stay: Payer: Medicaid Other

## 2015-05-16 LAB — BLOOD GAS, ARTERIAL
Acid-Base Excess: 18.3 mmol/L — ABNORMAL HIGH (ref 0.0–3.0)
Acid-Base Excess: 18.6 mmol/L — ABNORMAL HIGH (ref 0.0–3.0)
BICARBONATE: 47.9 meq/L — AB (ref 21.0–28.0)
Bicarbonate: 48.6 mEq/L — ABNORMAL HIGH (ref 21.0–28.0)
FIO2: 0.75
FIO2: 0.75
MECHANICAL RATE: 26
Mechanical Rate: 18
O2 Saturation: 92.7 %
PCO2 ART: 87 mmHg — AB (ref 32.0–48.0)
PEEP: 10 cmH2O
PEEP: 14 cmH2O
PH ART: 7.37 (ref 7.350–7.450)
PRESSURE CONTROL: 18 cmH2O
Patient temperature: 37
Patient temperature: 37.8
VT: 350 mL
pCO2 arterial: 84 mmHg (ref 32.0–48.0)
pH, Arterial: 7.36 (ref 7.350–7.450)
pO2, Arterial: 72 mmHg — ABNORMAL LOW (ref 83.0–108.0)

## 2015-05-16 LAB — GLUCOSE, CAPILLARY: GLUCOSE-CAPILLARY: 111 mg/dL — AB (ref 65–99)

## 2015-05-16 LAB — BASIC METABOLIC PANEL
ANION GAP: 8 (ref 5–15)
BUN: 14 mg/dL (ref 6–20)
CALCIUM: 8.1 mg/dL — AB (ref 8.9–10.3)
CO2: 43 mmol/L — ABNORMAL HIGH (ref 22–32)
Chloride: 97 mmol/L — ABNORMAL LOW (ref 101–111)
Creatinine, Ser: 0.47 mg/dL (ref 0.44–1.00)
GFR calc Af Amer: >60 mL/min (ref >60)
GLUCOSE: 117 mg/dL — AB (ref 65–99)
Potassium: 2.7 mmol/L — CL (ref 3.5–5.1)
SODIUM: 148 mmol/L — AB (ref 135–145)

## 2015-05-16 LAB — CBC
HCT: 27.5 % — ABNORMAL LOW (ref 35.0–47.0)
Hemoglobin: 8.7 g/dL — ABNORMAL LOW (ref 12.0–16.0)
MCH: 27.9 pg (ref 26.0–34.0)
MCHC: 31.5 g/dL — ABNORMAL LOW (ref 32.0–36.0)
MCV: 88.6 fL (ref 80.0–100.0)
PLATELETS: 166 10*3/uL (ref 150–440)
RBC: 3.11 MIL/uL — AB (ref 3.80–5.20)
RDW: 14.1 % (ref 11.5–14.5)
WBC: 21.5 10*3/uL — AB (ref 3.6–11.0)

## 2015-05-16 LAB — C DIFFICILE QUICK SCREEN W PCR REFLEX
C DIFFICILE (CDIFF) INTERP: NEGATIVE
C DIFFICILE (CDIFF) TOXIN: NEGATIVE
C DIFFICLE (CDIFF) ANTIGEN: NEGATIVE

## 2015-05-16 LAB — MAGNESIUM: MAGNESIUM: 1.7 mg/dL (ref 1.7–2.4)

## 2015-05-16 LAB — URINE CULTURE: CULTURE: NO GROWTH

## 2015-05-16 MED ORDER — MAGNESIUM SULFATE 2 GM/50ML IV SOLN
2.0000 g | Freq: Once | INTRAVENOUS | Status: AC
  Administered 2015-05-16: 2 g via INTRAVENOUS
  Filled 2015-05-16: qty 50

## 2015-05-16 MED ORDER — LEVOTHYROXINE SODIUM 100 MCG IV SOLR: 75.0000 ug | Freq: Every day | INTRAVENOUS | Status: DC

## 2015-05-16 MED ORDER — LEVOTHYROXINE SODIUM 100 MCG IV SOLR
75.0000 ug | Freq: Every day | INTRAVENOUS | Status: DC
  Administered 2015-05-16 – 2015-05-17 (×2): 75 ug via INTRAVENOUS
  Filled 2015-05-16 (×2): qty 5

## 2015-05-16 MED ORDER — POTASSIUM CHLORIDE 10 MEQ/100ML IV SOLN
10.0000 meq | INTRAVENOUS | Status: AC
  Administered 2015-05-16 (×4): 10 meq via INTRAVENOUS
  Filled 2015-05-16 (×4): qty 100

## 2015-05-16 MED ORDER — POTASSIUM CHLORIDE 20 MEQ/15ML (10%) PO SOLN
40.0000 meq | Freq: Once | ORAL | Status: AC
  Administered 2015-05-16: 40 meq
  Filled 2015-05-16: qty 30

## 2015-05-16 NOTE — Progress Notes (Signed)
eLink Physician-Brief Progress Note Patient Name: Noni SaupeRobin Gutman DOB: 1975/12/06 MRN: [id number]   Date of Service  05/16/2015  HPI/Events of Note  Notified of hypokalemia.  eICU Interventions  Magnesium add on to previous collection. KCl via tube & IV via central line.     Intervention Category Major Interventions: Electrolyte abnormality - evaluation and management  Lawanda CousinsJennings Jerri Glauser 05/16/2015, 5:44 AM

## 2015-05-16 NOTE — Care Management Note (Signed)
Case Management Note  Patient Details  Name: Noni SaupeRobin Casino MRN: [id number] Date of Birth: 1975/12/23  Subjective/Objective:  Vented on Propofol and Fentanyl gtt. Failed vent weanings and extubation.  Dr. Belia HemanKasa to meet with family this afternoon to discuss goals of care. Patient will most likely need vent trach and PEG. If so she will need vent SNF. RNCM and CSW following.                 Action/Plan: Will follow up after family discussion  Expected Discharge Date:                  Expected Discharge Plan:     In-House Referral:  Clinical Social Work  Discharge planning Services  CM Consult  Post Acute Care Choice:    Choice offered to:     DME Arranged:    DME Agency:     HH Arranged:    HH Agency:     Status of Service:  In process, will continue to follow  Medicare Important Message Given:    Date Medicare IM Given:    Medicare IM give by:    Date Additional Medicare IM Given:    Additional Medicare Important Message give by:     If discussed at Long Length of Stay Meetings, dates discussed:    Additional Comments:  Marily MemosLisa M Welborn Keena, RN 05/16/2015, 9:37 AM

## 2015-05-16 NOTE — Progress Notes (Signed)
eLink Physician-Brief Progress Note Patient Name: Noni SaupeRobin Teas DOB: Feb 25, 1976 MRN: [id number]   Date of Service  05/16/2015  HPI/Events of Note  RT notified of ABG 7.37/84/61 on PC with pressure 18 rate 18 PEEP 10 & FiO2 0.75. Patient reportedly sedate. ARDS per note. MV 8-10L. TV in 500s.  eICU Interventions  Switch to PRVC 6cc/kg IBW, PEEP 14, FiO2 0.75, Rate 26. Repeat ABG 30 min.     Intervention Category Major Interventions: Respiratory failure - evaluation and management  Lawanda CousinsJennings Kirin Pastorino 05/16/2015, 5:23 AM
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
## 2015-05-16 NOTE — Progress Notes (Signed)
Patient ID: Kathleen Gray, female   DOB: [birth year], 39 y.o.   MRN: [id number] [facility] PROGRESS NOTE  PCP: No PCP Per Patient  HPI/Subjective: Patient sedated.  Objective: Filed Vitals:   05/16/15 1200  BP: 119/76  Pulse: 111  Temp:   Resp: 18    Filed Weights   05/14/15 0500 05/15/15 0418 05/16/15 0444  Weight: 74.8 kg (164 lb 14.5 oz) 77.1 kg (169 lb 15.6 oz) 75.5 kg (166 lb 7.2 oz)    ROS: Review of Systems  Unable to perform ROS  secondary to being on the ventilator and on sedation Exam: Physical Exam  Constitutional:  Patient is sedated.  HENT:  Nose: No mucosal edema.  Unable to look in mouth.  Eyes: Conjunctivae and lids are normal. Pupils are equal, round, and reactive to light.  Neck: No JVD present. Carotid bruit is not present. No edema present. No thyroid mass and no thyromegaly present.  Cardiovascular: S1 normal and S2 normal.  Exam reveals no gallop.   No murmur heard. Pulses:      Dorsalis pedis pulses are 2+ on the right side, and 2+ on the left side.  Respiratory: No respiratory distress. She has no wheezes. She has no rhonchi. She has rales in the right lower field and the left lower field.  GI: Soft. Bowel sounds are normal. There is no tenderness.  Musculoskeletal:       Right ankle: She exhibits swelling.       Left ankle: She exhibits swelling.  Lymphadenopathy:    She has no cervical adenopathy.  Skin: Skin is warm. No rash noted. Nails show no clubbing.  Psychiatric:  No response today sternal rub.    Data Reviewed: Basic Metabolic Panel:  Recent Labs Lab 05/12/15 0359 05/13/15 0456 05/14/15 0414 05/15/15 0510 05/16/15 0512  NA 147* 153* 148* 149* 148*  K 3.6 3.5 3.2* 3.4* 2.7*  CL 117* 115* 111 104 97*  CO2 25 31 32 37* 43*  GLUCOSE 106* 120* 114* 116* 117*  BUN 17 23* 19 15 14   CREATININE 0.75 0.64 0.48 0.46 0.47  CALCIUM 7.8* 8.4* 8.2* 8.2* 8.1*  MG  --   --   --   --  1.7   Liver Function  Tests:  Recent Labs Lab 05/10/15 1024 05/11/15 0500 05/12/15 0359 05/13/15 0456 05/14/15 0414  AST 764* 1247* 649* 186* 84*  ALT 1099* 1895* 1760* 1172* 710*  ALKPHOS 63 82 102 100 109  BILITOT 1.0 1.1 0.6 0.7 0.6  PROT 5.2* 5.4* 5.6* 6.0* 5.4*  ALBUMIN 2.6* 2.5* 2.3* 2.2* 1.9*    CBC:  Recent Labs Lab 05/12/15 0359 05/13/15 0456 05/14/15 0414 05/15/15 0510 05/16/15 0512  WBC 17.0* 19.6* 17.1* 22.7* 21.5*  HGB 8.3* 8.3* 8.1* 8.8* 8.7*  HCT 25.6* 25.2* 24.7* 26.2* 27.5*  MCV 90.0 89.2 90.1 88.6 88.6  PLT 140* 139* 163 191 166   Cardiac Enzymes:  Recent Labs Lab 05/09/15 1741 05/10/15 0510 05/11/15 0500 05/13/15 0456 05/15/15 0510  CKTOTAL 40981* 14052* 5905* 857* 76    CBG:  Recent Labs Lab 05/14/15 2337 05/15/15 0737 05/15/15 1633 05/15/15 2319 05/16/15 0753  GLUCAP 86 103* 99 90 111*    Recent Results (from the past 240 hour(s))  MRSA PCR Screening     Status: None   Collection Time: 05/21/2015 11:05 PM  Result Value Ref Range Status   MRSA by PCR NEGATIVE NEGATIVE Final    Comment:  The GeneXpert MRSA Assay (FDA approved for NASAL specimens only), is one component of a comprehensive MRSA colonization surveillance program. It is not intended to diagnose MRSA infection nor to guide or monitor treatment for MRSA infections.   Urine culture     Status: None   Collection Time: 05/07/15  8:32 AM  Result Value Ref Range Status   Specimen Description URINE, CATHETERIZED  Final   Special Requests Normal  Final   Culture NO GROWTH 2 DAYS  Final   Report Status 05/09/2015 FINAL  Final  Culture, blood (routine x 2)     Status: None   Collection Time: 05/07/15  8:43 AM  Result Value Ref Range Status   Specimen Description BLOOD RIGHT AC  Final   Special Requests BOTTLES DRAWN AEROBIC AND ANAEROBIC  7CC  Final   Culture NO GROWTH 5 DAYS  Final   Report Status 05/12/2015 FINAL  Final  Culture, blood (routine x 2)     Status: None    Collection Time: 05/07/15  8:55 AM  Result Value Ref Range Status   Specimen Description BLOOD RIGHT HAND  Final   Special Requests   Final    BOTTLES DRAWN AEROBIC AND ANAEROBIC  AER 7CC ANA 10CC   Culture NO GROWTH 5 DAYS  Final   Report Status 05/12/2015 FINAL  Final  Culture, respiratory (NON-Expectorated)     Status: None   Collection Time: 05/09/15  3:47 PM  Result Value Ref Range Status   Specimen Description TRACHEAL ASPIRATE  Final   Special Requests NONE  Final   Gram Stain   Final    EXCELLENT SPECIMEN - 90-100% WBCS MODERATE WBC SEEN RARE GRAM POSITIVE COCCI    Culture APPEARS TO BE NORMAL RESPIRATORY FLORA  Final   Report Status 05/11/2015 FINAL  Final  Culture, blood (routine x 2)     Status: None (Preliminary result)   Collection Time: 05/13/15 12:46 PM  Result Value Ref Range Status   Specimen Description BLOOD RIGHT AC  Final   Special Requests BOTTLES DRAWN AEROBIC AND ANAEROBIC  15CC  Final   Culture NO GROWTH 3 DAYS  Final   Report Status PENDING  Incomplete  Culture, blood (routine x 2)     Status: None (Preliminary result)   Collection Time: 05/13/15 12:57 PM  Result Value Ref Range Status   Specimen Description BLOOD RIGHT WRIST  Final   Special Requests   Final    BOTTLES DRAWN AEROBIC AND ANAEROBIC  AER 15CC ANA 10CC   Culture NO GROWTH 3 DAYS  Final   Report Status PENDING  Incomplete  Urine culture     Status: None   Collection Time: 05/13/15  2:52 PM  Result Value Ref Range Status   Specimen Description URINE, RANDOM  Final   Special Requests NONE  Final   Culture NO GROWTH 2 DAYS  Final   Report Status 05/15/2015 FINAL  Final  Culture, respiratory (NON-Expectorated)     Status: None   Collection Time: 05/13/15  3:16 PM  Result Value Ref Range Status   Specimen Description TRACHEAL ASPIRATE  Final   Special Requests NONE  Final   Gram Stain   Final    MODERATE WBC SEEN NO ORGANISMS SEEN GOOD SPECIMEN - 80-90% WBCS    Culture NO GROWTH 2  DAYS  Final   Report Status 05/15/2015 FINAL  Final  Culture, blood (routine x 2)     Status: None (Preliminary result)   Collection  Time: 05/14/15  7:34 PM  Result Value Ref Range Status   Specimen Description BLOOD RIGHT ARM  Final   Special Requests BOTTLES DRAWN AEROBIC AND ANAEROBIC 7CC  Final   Culture NO GROWTH 2 DAYS  Final   Report Status PENDING  Incomplete  Culture, blood (routine x 2)     Status: None (Preliminary result)   Collection Time: 05/14/15  7:38 PM  Result Value Ref Range Status   Specimen Description BLOOD RIGHT ARM  Final   Special Requests BOTTLES DRAWN AEROBIC AND ANAEROBIC 7CC  Final   Culture NO GROWTH 2 DAYS  Final   Report Status PENDING  Incomplete  Urine culture     Status: None   Collection Time: 05/14/15 10:45 PM  Result Value Ref Range Status   Specimen Description URINE, RANDOM  Final   Special Requests NONE  Final   Culture NO GROWTH 2 DAYS  Final   Report Status 05/16/2015 FINAL  Final  Stool culture     Status: None (Preliminary result)   Collection Time: 05/14/15 10:45 PM  Result Value Ref Range Status   Specimen Description STOOL  Final   Special Requests NONE  Final   Culture   Final    NO SALMONELLA OR SHIGELLA ISOLATED NO CAMPYLOBACTER DETECTED No Pathogenic E. coli detected REDUCED NORMAL FECAL FLORA    Report Status PENDING  Incomplete  C difficile quick scan w PCR reflex     Status: None   Collection Time: 05/15/15 11:08 PM  Result Value Ref Range Status   C Diff antigen NEGATIVE NEGATIVE Final   C Diff toxin NEGATIVE NEGATIVE Final   C Diff interpretation Negative for C. difficile  Final     Studies: Dg Chest 1 View  05/16/2015  CLINICAL DATA:  Dyspnea. EXAM: CHEST 1 VIEW COMPARISON:  Chest radiograph from one day prior. FINDINGS: Endotracheal tube tip is 2.5 cm above the carina. Enteric tube enters the stomach, with the tip not seen on this image. Right internal jugular central venous catheter terminates over the right  atrium. No pneumothorax. No appreciable pleural effusion. Severe diffuse fluffy airspace opacities throughout both lungs appears worsened in the interval, particularly in the left upper lobe. IMPRESSION: Severe diffuse fluffy airspace opacities throughout both lungs, with interval worsening, differential includes ARDS, multifocal pneumonia and/or diffuse alveolar hemorrhage. Electronically Signed   By: Delbert PhenixJason A Poff M.D.   On: 05/16/2015 07:28   Dg Chest 1 View  05/15/2015  CLINICAL DATA:  Dyspnea EXAM: CHEST 1 VIEW COMPARISON:  05/13/2015 FINDINGS: Tubular device is stable. Extensive confluent bilateral airspace disease is worse. No pneumothorax. Normal heart size. IMPRESSION: Worsening confluent bilateral airspace disease. Electronically Signed   By: Jolaine ClickArthur  Hoss M.D.   On: 05/15/2015 08:28    Scheduled Meds: . acetaminophen (TYLENOL) oral liquid 160 mg/5 mL  650 mg Per Tube 4 times per day  . antiseptic oral rinse  7 mL Mouth Rinse QID  . cefTAZidime (FORTAZ)  IV  2 g Intravenous 3 times per day  . chlorhexidine gluconate  15 mL Mouth Rinse BID  . enoxaparin (LOVENOX) injection  40 mg Subcutaneous Q24H  . free water  200 mL Per Tube Q4H  . levETIRAcetam  500 mg Intravenous Q12H  . levothyroxine  75 mcg Intravenous Daily  . pantoprazole sodium  40 mg Per Tube Q1200  . vancomycin  1,000 mg Intravenous Q12H   Continuous Infusions: . feeding supplement (VITAL HIGH PROTEIN) 1,000 mL (05/16/15 0000)  .  fentaNYL infusion INTRAVENOUS 300 mcg/hr (05/16/15 0600)  . propofol (DIPRIVAN) infusion 50 mcg/kg/min (05/16/15 1148)    Assessment/Plan:  1. 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 Metabolic encephalopathy. Patient is on Keppra and neurology is following. Last EEG showed diffuse slowing but no active seizure. Repeat CT scan negative. 2. Ventricular tachycardia cardiac arrest and lactic acidosis 3. Aspiration pneumonia, high fever- on ceftazidime, vancomycin. All cultures on the 14th  and 15th are still negative to date including for blood cultures to urine cultures and 2 respiratory cultures. Stool for C. difficile is negative and stool studies are negative. 4. Acute respiratory failure with hypoxia- patient required reintubation. Full vent support at this time, now requiring 70% oxygen. Could be ARDS on the chest x-ray. Intermittent Lasix. Pulmonary to speak with family about goals of care today 5. Acute systolic congestive heart failure and fluid overload- patient receiving a lot of fluids with sedation and antibiotics. Intermittent IV Lasix. 6. Hypokalemia- electrolyte protocol 7. Anemia- hemoglobin up at 8.7 today. 7.   Rhabdomyolysis- patient's CPK normalized. Likely secondary to status epilepticus. 8.   Shock liver- ALT still elevated above 1000. AST has come down 9.   Diarrhea- stool studies negative and C. difficile negative. 10. Hypernatremia- free water via the tube. 11. Anxiety depression and suicide attempt.  Code Status:     Code Status Orders        Start     Ordered   05/13/2015 2311  Full code   Continuous     05/18/2015 2310     Family Communication: Dr. Nicanor Bake to have a family meeting today Disposition Plan: To be determined  Consultants:  Critical care specialist  Neurology  Antibiotics:  Vancomycin  Ceftazidime  Time spent: 30 minutes, patient is critically ill and high risk for cardiopulmonary arrest.   Alford Highland  [facility]

## 2015-05-16 NOTE — Progress Notes (Signed)
Pt profile: [age] F with multiple previous suicide attempts took large OD of bupropion 10/07 c/b acute resp failure, protracted seizures, shock. Improved 10/10 and able to F/C intermittently. Failed extubation 10/10 with pulm edema pattern on CXR after re-intubation, still remains intubated,sedated high vent settings, failure to wean from vent  Active problems: VDRF--ARDS.  Pulm edema Possible aspiration Failed extubation 10/10 Cardiomyopathy (LVEF 40%) Shock, resolved Hypokalemia Acute encephalopathy Seizures due to bupropion OD   Lines, Tubes, etc: ETT 10/07 >> 10/10, 10/10 >>  R femoral A-line 10/09 >> 10/12 R IJ CVL 10/09 >>    Microbiology: MRSA PCR 10/07 >> NEG Urine 10/08 >> NEG Resp 10/10 >> NOF Blood 10/08 >> NEG PCT 10/11: 4.32, 1.98,   Antibiotics:  Pip-tazo 10/09 >> 10/10 Vanc 10/09 >> 10/13 >>Restart 10/16. Ceftaz 10/11 >>   Studies/Events: 10/08 CT head: normal 10/09 TTE: LVEF 40-45% 10/10 Failed extubation. 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 10/12 Ct head: NAD  Consults:  PCCM 10/08 Neuro 10/08  Best Practice: DVT: LMWH SUP: PPI Nutrition: TFs 10/11 Glycemic control: no SSI indicated Sedation/analgesia: Propofol  Subj: RASS --2 to +2, + F/C intermittent  Obj: Filed Vitals:   05/16/15 1000  BP: 127/67  Pulse: 112  Temp:   Resp: 20    Gen: WDWN, RASS -1, intubated HEENT: NCAT, EOMI, PERRL Neck: No JVD Chest: diffuse rhonchi Cardiac: reg, no M Abd: soft, NT Ext: warm, no edema Neuro: MAEs vigorously to stimulation  BMET BMP Latest Ref Rng 05/16/2015 05/15/2015 05/14/2015  Glucose 65 - 99 mg/dL 161(W117(H) 960(A116(H) 540(J114(H)  BUN 6 - 20 mg/dL 14 15 19   Creatinine 0.44 - 1.00 mg/dL 8.110.47 9.140.46 7.820.48  Sodium 135 - 145 mmol/L 148(H) 149(H) 148(H)  Potassium 3.5 - 5.1 mmol/L 2.7(LL) 3.4(L) 3.2(L)  Chloride 101 - 111 mmol/L 97(L) 104 111  CO2 22  - 32 mmol/L 43(H) 37(H) 32  Calcium 8.9 - 10.3 mg/dL 8.1(L) 8.2(L) 8.2(L)     CBC CBC Latest Ref Rng 05/16/2015 05/15/2015 05/14/2015  WBC 3.6 - 11.0 K/uL 21.5(H) 22.7(H) 17.1(H)  Hemoglobin 12.0 - 16.0 g/dL 9.5(A8.7(L) 2.1(H8.8(L) 8.1(L)  Hematocrit 35.0 - 47.0 % 27.5(L) 26.2(L) 24.7(L)  Platelets 150 - 440 K/uL 166 191 163      CXR: slightly improved edema/ALI pattern   IMPRESSION:39 yo white female s/p drug OD with acute resp failure with acute cardiomyopathy complciated by ARDS/pneumonia with severe hypoxia and failure to wean from vent  VDRF Failed extubation 10/10; possible ARDS, currently on AC mode,fio2 75%, PEEP 16 pulm edema Likely aspiration Acute lung injury/ARDS Cardiomyopathy - likely toxic Hypokalemia Hypernatremia Rhabdomyolysis - CK improving Lactic acidosis, resolved Elevated LFTs - improving Agitated delirium  PLAN/RECS:  Cont vent support - settings reviewed and/or adjusted Cont vent bundle Daily SBT if/when meets criteria MAP goal > 60 mmHg Monitor BMET intermittently Monitor I/Os Correct electrolytes as indicated Cont SUP and DVT px Cont TFs Cont anticonvulsants per Neuro Will change sedation to propofol.    Will  Meet with family today to discuss plan of action-will discuss need for Trach and PEG tube  I have personally obtained a history, examined the patient, evaluated Pertinent laboratory and RadioGraphic/imaging results, and  formulated the assessment and plan   The Patient requires high complexity decision making for assessment and support, frequent evaluation and titration of therapies, application of advanced monitoring technologies and extensive interpretation  of multiple databases. Critical Care Time devoted to patient care services described in this note is 45 minutes.   Overall, patient is critically ill, prognosis is guarded.  Patient with Multiorgan failure and at high risk for cardiac arrest and death.    Lucie Leather, M.D.   [facility]  Medical Director [facility] Medical Director [facility] Cardio-Pulmonary Department

## 2015-05-16 NOTE — Consult Note (Signed)
I had a discussion with Son and Daughter, and they have decided that  Patient has had a rough life and her medical condition will not improve her well being and they have decided on Comfort Care Measures in 48-72 hrs after relaying plan to friends and family.  I have personally obtained a history, examined the patient, evaluated Pertinent laboratory and RadioGraphic/imaging results, and  formulated the assessment and plan  The Patient requires high complexity decision making for assessment and support, frequent evaluation and titration of therapies, application of advanced monitoring technologies and extensive interpretation of multiple databases.  Overall, patient is critically ill, prognosis is guarded.    Palliative Care Dr. Orvan Falconerampbell to follow up with family  Lucie LeatherKurian David Ramell Wacha, M.D.  [facility]  Medical Director [facility] Medical Director [facility] Cardio[facility]

## 2015-05-16 NOTE — Progress Notes (Signed)
eLink Physician-Brief Progress Note Patient Name: Kathleen SaupeRobin Colglazier DOB: [birth year] MRN: [id number]   Date of Service  05/16/2015  HPI/Events of Note  RT notified of results of ABG on current settings. Ventilation adequate. Improving oxygenation with continued recruitment. Peak pressure 29 reportedly.  eICU Interventions  Increase PEEP to 16. Wean FiO2 for Sat >94%. Instructed RT not to wean PEEP until FiO2 reaches 0.4.     Intervention Category Major Interventions: Respiratory failure - evaluation and management  [name] 05/16/2015, 6:19 AM

## 2015-05-16 NOTE — Clinical Social Work Note (Signed)
CSW has noted that patient's children have chosen comfort measures for patient at this time. York SpanielMonica Kimberley Gray MSW,LCSW [phone]

## 2015-05-16 NOTE — Progress Notes (Addendum)
ANTIBIOTIC CONSULT NOTE - FOLLOW UP  Pharmacy Consult for Ceftazidime/Vancomycin Indication: HCAP/?aspiration PNA  No Known Allergies  Patient Measurements: Height:  (165.1 cm) Weight: 166 lb 7.2 oz (75.5 kg) IBW/kg (Calculated) : 57  Vital Signs: Temp: 99.4 F (37.4 C) (10/17 0730) Temp Source: Oral (10/17 0730) BP: 119/76 mmHg (10/17 1200) Pulse Rate: 111 (10/17 1200) Intake/Output from previous day: 10/16 0701 - 10/17 0700 In: 3975.1 [I.V.:1125.7; WU/JW:1191.4; IV Piggyback:865] Out: 4050 [Urine:4050] Intake/Output from this shift: Total I/O In: 1458.4 [I.V.:314.4; NG/GT:494; IV Piggyback:650] Out: 540 [Urine:540]  Labs:  Recent Labs  05/14/15 0414 05/15/15 0510 05/16/15 0512  WBC 17.1* 22.7* 21.5*  HGB 8.1* 8.8* 8.7*  PLT 163 191 166  CREATININE 0.48 0.46 0.47   Estimated Creatinine Clearance: 97.9 mL/min (by C-G formula based on Cr of 0.47).  Microbiology: Recent Results (from the past 720 hour(s))  MRSA PCR Screening     Status: None   Collection Time: 06-02-2015 11:05 PM  Result Value Ref Range Status   MRSA by PCR NEGATIVE NEGATIVE Final    Comment:        The GeneXpert MRSA Assay (FDA approved for NASAL specimens only), is one component of a comprehensive MRSA colonization surveillance program. It is not intended to diagnose MRSA infection nor to guide or monitor treatment for MRSA infections.   Urine culture     Status: None   Collection Time: 05/07/15  8:32 AM  Result Value Ref Range Status   Specimen Description URINE, CATHETERIZED  Final   Special Requests Normal  Final   Culture NO GROWTH 2 DAYS  Final   Report Status 05/09/2015 FINAL  Final  Culture, blood (routine x 2)     Status: None   Collection Time: 05/07/15  8:43 AM  Result Value Ref Range Status   Specimen Description BLOOD RIGHT AC  Final   Special Requests BOTTLES DRAWN AEROBIC AND ANAEROBIC  7CC  Final   Culture NO GROWTH 5 DAYS  Final   Report Status 05/12/2015 FINAL   Final  Culture, blood (routine x 2)     Status: None   Collection Time: 05/07/15  8:55 AM  Result Value Ref Range Status   Specimen Description BLOOD RIGHT HAND  Final   Special Requests   Final    BOTTLES DRAWN AEROBIC AND ANAEROBIC  AER 7CC ANA 10CC   Culture NO GROWTH 5 DAYS  Final   Report Status 05/12/2015 FINAL  Final  Culture, respiratory (NON-Expectorated)     Status: None   Collection Time: 05/09/15  3:47 PM  Result Value Ref Range Status   Specimen Description TRACHEAL ASPIRATE  Final   Special Requests NONE  Final   Gram Stain   Final    EXCELLENT SPECIMEN - 90-100% WBCS MODERATE WBC SEEN RARE GRAM POSITIVE COCCI    Culture APPEARS TO BE NORMAL RESPIRATORY FLORA  Final   Report Status 05/11/2015 FINAL  Final  Culture, blood (routine x 2)     Status: None (Preliminary result)   Collection Time: 05/13/15 12:46 PM  Result Value Ref Range Status   Specimen Description BLOOD RIGHT AC  Final   Special Requests BOTTLES DRAWN AEROBIC AND ANAEROBIC  15CC  Final   Culture NO GROWTH 3 DAYS  Final   Report Status PENDING  Incomplete  Culture, blood (routine x 2)     Status: None (Preliminary result)   Collection Time: 05/13/15 12:57 PM  Result Value Ref Range Status   Specimen  Description BLOOD RIGHT WRIST  Final   Special Requests   Final    BOTTLES DRAWN AEROBIC AND ANAEROBIC  AER 15CC ANA 10CC   Culture NO GROWTH 3 DAYS  Final   Report Status PENDING  Incomplete  Urine culture     Status: None   Collection Time: 05/13/15  2:52 PM  Result Value Ref Range Status   Specimen Description URINE, RANDOM  Final   Special Requests NONE  Final   Culture NO GROWTH 2 DAYS  Final   Report Status 05/15/2015 FINAL  Final  Culture, respiratory (NON-Expectorated)     Status: None   Collection Time: 05/13/15  3:16 PM  Result Value Ref Range Status   Specimen Description TRACHEAL ASPIRATE  Final   Special Requests NONE  Final   Gram Stain   Final    MODERATE WBC SEEN NO ORGANISMS  SEEN GOOD SPECIMEN - 80-90% WBCS    Culture NO GROWTH 2 DAYS  Final   Report Status 05/15/2015 FINAL  Final  Culture, blood (routine x 2)     Status: None (Preliminary result)   Collection Time: 05/14/15  7:34 PM  Result Value Ref Range Status   Specimen Description BLOOD RIGHT ARM  Final   Special Requests BOTTLES DRAWN AEROBIC AND ANAEROBIC 7CC  Final   Culture NO GROWTH 2 DAYS  Final   Report Status PENDING  Incomplete  Culture, blood (routine x 2)     Status: None (Preliminary result)   Collection Time: 05/14/15  7:38 PM  Result Value Ref Range Status   Specimen Description BLOOD RIGHT ARM  Final   Special Requests BOTTLES DRAWN AEROBIC AND ANAEROBIC 7CC  Final   Culture NO GROWTH 2 DAYS  Final   Report Status PENDING  Incomplete  Urine culture     Status: None   Collection Time: 05/14/15 10:45 PM  Result Value Ref Range Status   Specimen Description URINE, RANDOM  Final   Special Requests NONE  Final   Culture NO GROWTH 2 DAYS  Final   Report Status 05/16/2015 FINAL  Final  Stool culture     Status: None (Preliminary result)   Collection Time: 05/14/15 10:45 PM  Result Value Ref Range Status   Specimen Description STOOL  Final   Special Requests NONE  Final   Culture   Final    NO SALMONELLA OR SHIGELLA ISOLATED NO CAMPYLOBACTER DETECTED No Pathogenic E. coli detected REDUCED NORMAL FECAL FLORA    Report Status PENDING  Incomplete  C difficile quick scan w PCR reflex     Status: None   Collection Time: 05/15/15 11:08 PM  Result Value Ref Range Status   C Diff antigen NEGATIVE NEGATIVE Final   C Diff toxin NEGATIVE NEGATIVE Final   C Diff interpretation Negative for C. difficile  Final    Anti-infectives    Start     Dose/Rate Route Frequency Ordered Stop   05/15/15 2200  vancomycin (VANCOCIN) IVPB 1000 mg/200 mL premix     1,000 mg 200 mL/hr over 60 Minutes Intravenous Every 12 hours 05/15/15 1435     05/15/15 1500  vancomycin (VANCOCIN) IVPB 1000 mg/200 mL  premix     1,000 mg 200 mL/hr over 60 Minutes Intravenous  Once 05/15/15 1435 05/15/15 1652   05/10/15 2000  vancomycin (VANCOCIN) 1,250 mg in sodium chloride 0.9 % 250 mL IVPB  Status:  Discontinued     1,250 mg 166.7 mL/hr over 90 Minutes Intravenous Every 12  hours 05/10/15 1103 05/12/15 0959   05/10/15 1200  vancomycin (VANCOCIN) 1,250 mg in sodium chloride 0.9 % 250 mL IVPB     1,250 mg 166.7 mL/hr over 90 Minutes Intravenous  Once 05/10/15 1103 05/10/15 1424   05/10/15 1115  cefTAZidime (FORTAZ) 2 g in dextrose 5 % 50 mL IVPB     2 g 100 mL/hr over 30 Minutes Intravenous 3 times per day 05/10/15 1103     05/09/15 1300  vancomycin (VANCOCIN) IVPB 750 mg/150 ml premix  Status:  Discontinued     750 mg 150 mL/hr over 60 Minutes Intravenous Every 12 hours 05/09/15 0918 05/09/15 0952   05/09/15 0130  vancomycin (VANCOCIN) IVPB 750 mg/150 ml premix  Status:  Discontinued     750 mg 150 mL/hr over 60 Minutes Intravenous Every 8 hours 05/08/15 1644 05/09/15 0918   05/08/15 2345  piperacillin-tazobactam (ZOSYN) IVPB 3.375 g  Status:  Discontinued     3.375 g 12.5 mL/hr over 240 Minutes Intravenous 3 times per day 05/08/15 2335 05/09/15 0952   05/08/15 1645  vancomycin (VANCOCIN) 1,750 mg in sodium chloride 0.9 % 500 mL IVPB     1,750 mg 250 mL/hr over 120 Minutes Intravenous  Once 05/08/15 1644 05/08/15 1933     Assessment: 39 yo female admitted for bupropion OD, acute respiratory failure and seizures. Patient failed extubation on 10/10. Pharmacy consulted on 10/11 for dosing and monitoring of Vancomycin and Ceftazidime for HCAP. Vancomycin discontinued 10/13, currently patient is receiving ceftazidime 2g every 8 hours.   Vancomycin resumed 10/16.    Plan:  Will continue Ceftazidime 2gm every 8 hours.  Will continue vancomyicn 1000 iv q 12 hours. A trough is scheduled with the 5th dose on 10/18 at 0930.  Luisa HartScott Jemell Town, PharmD Clinical Pharmacist 05/16/2015 12:18 PM

## 2015-05-16 NOTE — Progress Notes (Addendum)
Nutrition Follow-up       INTERVENTION:  EN: Continue vital high protein at 5249ml/hr to meet nutritional needs with diprivan.  Discussed elevated Na in ICU rounds with Dr. Belia HemanKasa and planning to recheck and re-evaluate in am. Continue free water flush of 200ml q 4 hr at this time   NUTRITION DIAGNOSIS:   Inadequate oral intake related to acute illness as evidenced by NPO status. Being addressed with tube feeding    GOAL:   Provide needs based on ASPEN/SCCM guidelines    MONITOR:    (Energy Intake, Anthropometrics, Electrolyte/Renal Profile, Glucose Profile, Digestive System)  REASON FOR ASSESSMENT:   Ventilator    ASSESSMENT:      Pt remains on vent   Current Nutrition: Pt tolerating vital high protein at 2049ml/hr   Gastrointestinal Profile: no signs of GI intolerance, noted residual of 540ml then 60ml, 120ml, 35ml Last BM: rectal tube in place   Medications: D5 discontinued this am, diprivan at 7622ml/hr (providing 591 kcals in 24 hr) other medications reviewed  Electrolyte/Renal Profile and Glucose Profile:   Recent Labs Lab 05/14/15 0414 05/15/15 0510 05/16/15 0512  NA 148* 149* 148*  K 3.2* 3.4* 2.7*  CL 111 104 97*  CO2 32 37* 43*  BUN 19 15 14   CREATININE 0.48 0.46 0.47  CALCIUM 8.2* 8.2* 8.1*  MG  --   --  1.7  GLUCOSE 114* 116* 117*   Protein Profile:  Recent Labs Lab 05/12/15 0359 05/13/15 0456 05/14/15 0414  ALBUMIN 2.3* 2.2* 1.9*        Weight Trend since Admission: Filed Weights   05/14/15 0500 05/15/15 0418 05/16/15 0444  Weight: 164 lb 14.5 oz (74.8 kg) 169 lb 15.6 oz (77.1 kg) 166 lb 7.2 oz (75.5 kg)      Diet Order:   NPO  Skin:   reviewed  Height:   Ht Readings from Last 1 Encounters:  2014-12-18 5\' 5"  (1.651 m)    Weight:   Wt Readings from Last 1 Encounters:  05/16/15 166 lb 7.2 oz (75.5 kg)    Ideal Body Weight:     BMI:  Body mass index is 27.7 kg/(m^2).  Estimated Nutritional Needs:   Kcal:  1846  kcals (BEE: 1466, Ve: 12, Tmax: 37.8) using current wt of 74.5  Protein:  89-149 g (1.2-2.0 g/kg)   Fluid:  1875-2250 mL (25-30 ml/kg)   EDUCATION NEEDS:   No education needs identified at this time  HIGH Care Level  Geo Slone B. Freida BusmanAllen, RD, LDN [phone] (pager)

## 2015-05-17 DIAGNOSIS — I469 Cardiac arrest, cause unspecified: Secondary | ICD-10-CM

## 2015-05-17 DIAGNOSIS — T794XXA Traumatic shock, initial encounter: Secondary | ICD-10-CM

## 2015-05-17 DIAGNOSIS — Z515 Encounter for palliative care: Secondary | ICD-10-CM

## 2015-05-17 DIAGNOSIS — J690 Pneumonitis due to inhalation of food and vomit: Secondary | ICD-10-CM

## 2015-05-17 DIAGNOSIS — G4089 Other seizures: Secondary | ICD-10-CM

## 2015-05-17 DIAGNOSIS — M6282 Rhabdomyolysis: Secondary | ICD-10-CM

## 2015-05-17 DIAGNOSIS — T43292A Poisoning by other antidepressants, intentional self-harm, initial encounter: Principal | ICD-10-CM

## 2015-05-17 DIAGNOSIS — G9341 Metabolic encephalopathy: Secondary | ICD-10-CM

## 2015-05-17 DIAGNOSIS — G931 Anoxic brain damage, not elsewhere classified: Secondary | ICD-10-CM | POA: Insufficient documentation

## 2015-05-17 DIAGNOSIS — T1491 Suicide attempt: Secondary | ICD-10-CM

## 2015-05-17 DIAGNOSIS — Z9911 Dependence on respirator [ventilator] status: Secondary | ICD-10-CM

## 2015-05-17 LAB — MAGNESIUM: MAGNESIUM: 2.1 mg/dL (ref 1.7–2.4)

## 2015-05-17 LAB — COMPREHENSIVE METABOLIC PANEL
ALBUMIN: 1.7 g/dL — AB (ref 3.5–5.0)
ALK PHOS: 213 U/L — AB (ref 38–126)
ALT: 233 U/L — ABNORMAL HIGH (ref 14–54)
ANION GAP: 6 (ref 5–15)
AST: 85 U/L — ABNORMAL HIGH (ref 15–41)
BILIRUBIN TOTAL: 1 mg/dL (ref 0.3–1.2)
BUN: 13 mg/dL (ref 6–20)
CALCIUM: 8.2 mg/dL — AB (ref 8.9–10.3)
CO2: 44 mmol/L — ABNORMAL HIGH (ref 22–32)
Chloride: 98 mmol/L — ABNORMAL LOW (ref 101–111)
Creatinine, Ser: 0.42 mg/dL — ABNORMAL LOW (ref 0.44–1.00)
GFR calc Af Amer: >60 mL/min (ref >60)
GLUCOSE: 120 mg/dL — AB (ref 65–99)
Potassium: 3.4 mmol/L — ABNORMAL LOW (ref 3.5–5.1)
Sodium: 148 mmol/L — ABNORMAL HIGH (ref 135–145)
TOTAL PROTEIN: 5.5 g/dL — AB (ref 6.5–8.1)

## 2015-05-17 LAB — BLOOD GAS, ARTERIAL
ALLENS TEST (PASS/FAIL): POSITIVE — AB
Acid-Base Excess: 20.5 mmol/L — ABNORMAL HIGH (ref 0.0–3.0)
Bicarbonate: 51.8 mEq/L — ABNORMAL HIGH (ref 21.0–28.0)
FIO2: 60
O2 Saturation: 91.1 %
PEEP: 16 cmH2O
Patient temperature: 37
RATE: 26 resp/min
VT: 350 mL
pCO2 arterial: 96 mmHg (ref 32.0–48.0)
pH, Arterial: 7.34 — ABNORMAL LOW (ref 7.350–7.450)
pO2, Arterial: 65 mmHg — ABNORMAL LOW (ref 83.0–108.0)

## 2015-05-17 LAB — STOOL CULTURE

## 2015-05-17 LAB — GLUCOSE, CAPILLARY
GLUCOSE-CAPILLARY: 137 mg/dL — AB (ref 65–99)
GLUCOSE-CAPILLARY: 146 mg/dL — AB (ref 65–99)
Glucose-Capillary: 104 mg/dL — ABNORMAL HIGH (ref 65–99)

## 2015-05-17 LAB — PHOSPHORUS: PHOSPHORUS: 3.1 mg/dL (ref 2.5–4.6)

## 2015-05-17 LAB — VANCOMYCIN, TROUGH: VANCOMYCIN TR: 5 ug/mL — AB (ref 10–20)

## 2015-05-17 MED ORDER — MORPHINE SULFATE (PF) 4 MG/ML IV SOLN: 3.0000 mg | INTRAVENOUS | Status: DC | PRN

## 2015-05-17 MED ORDER — GLYCOPYRROLATE 0.2 MG/ML IJ SOLN: 0.4000 mg | Freq: Three times a day (TID) | INTRAMUSCULAR | Status: DC | PRN

## 2015-05-17 MED ORDER — BISACODYL 10 MG RE SUPP: 10.0000 mg | Freq: Every day | RECTAL | Status: DC | PRN

## 2015-05-17 MED ORDER — MORPHINE SULFATE (PF) 2 MG/ML IV SOLN: 1.0000 mg | INTRAVENOUS | Status: DC | PRN

## 2015-05-17 MED ORDER — VECURONIUM BROMIDE 10 MG IV SOLR
10.0000 mg | INTRAVENOUS | Status: DC | PRN
  Administered 2015-05-17 – 2015-05-18 (×4): 10 mg via INTRAVENOUS
  Filled 2015-05-17 (×5): qty 10

## 2015-05-17 MED ORDER — VECURONIUM BROMIDE 10 MG IV SOLR
INTRAVENOUS | Status: AC
  Administered 2015-05-17: 10 mg
  Filled 2015-05-17: qty 10

## 2015-05-17 MED ORDER — POTASSIUM CHLORIDE 10 MEQ/100ML IV SOLN
10.0000 meq | INTRAVENOUS | Status: AC
  Administered 2015-05-17 (×2): 10 meq via INTRAVENOUS
  Filled 2015-05-17 (×2): qty 100

## 2015-05-17 MED ORDER — ONDANSETRON HCL 4 MG/2ML IJ SOLN: 4.0000 mg | Freq: Four times a day (QID) | INTRAMUSCULAR | Status: DC | PRN

## 2015-05-17 NOTE — Progress Notes (Signed)
Nutrition Follow-up    INTERVENTION:  EN: continue vital high protein at 3449ml/hr at this time to meet nutritional needs.  No change in free water flush or tube feeding rate, discussed in ICU rounds with Dr. Belia HemanKasa.  Monitoring Na   NUTRITION DIAGNOSIS:   Inadequate oral intake related to acute illness as evidenced by NPO status, being addressed with tube feeding     GOAL:   Provide needs based on ASPEN/SCCM guidelines    MONITOR:    (Energy Intake, Anthropometrics, Electrolyte/Renal Profile, Glucose Profile, Digestive System)  REASON FOR ASSESSMENT:   Ventilator    ASSESSMENT:      Pt remains on vent, planning extubation on Thursday with DNR status   Current Nutrition: Pt tolerating vital high protein at 4649ml/hr   Gastrointestinal Profile: no signs or symptoms of GI intolerance Last BM: rectal tube in place with liquid stool   Scheduled Medications:  . acetaminophen (TYLENOL) oral liquid 160 mg/5 mL  650 mg Per Tube 4 times per day  . antiseptic oral rinse  7 mL Mouth Rinse QID  . chlorhexidine gluconate  15 mL Mouth Rinse BID  . enoxaparin (LOVENOX) injection  40 mg Subcutaneous Q24H  . free water  200 mL Per Tube Q4H  . levETIRAcetam  500 mg Intravenous Q12H  . levothyroxine  75 mcg Intravenous Daily  . pantoprazole sodium  40 mg Per Tube Q1200  . potassium chloride  10 mEq Intravenous Q1 Hr x 2    Continuous Medications:  . feeding supplement (VITAL HIGH PROTEIN) 1,000 mL (05/17/15 0800)  . fentaNYL infusion INTRAVENOUS 300 mcg/hr (05/17/15 0800)  . propofol (DIPRIVAN) infusion 50 mcg/kg/min (05/17/15 0956)  diprivan providing 591 kcals in 24 hr   Electrolyte/Renal Profile and Glucose Profile:   Recent Labs Lab 05/15/15 0510 05/16/15 0512 05/17/15 0502  NA 149* 148* 148*  K 3.4* 2.7* 3.4*  CL 104 97* 98*  CO2 37* 43* 44*  BUN 15 14 13   CREATININE 0.46 0.47 0.42*  CALCIUM 8.2* 8.1* 8.2*  MG  --  1.7 2.1  GLUCOSE 116* 117* 120*   Protein  Profile:  Recent Labs Lab 05/13/15 0456 05/14/15 0414 05/17/15 0502  ALBUMIN 2.2* 1.9* 1.7*      Weight Trend since Admission: Filed Weights   05/15/15 0418 05/16/15 0444 05/17/15 0600  Weight: 169 lb 15.6 oz (77.1 kg) 166 lb 7.2 oz (75.5 kg) 167 lb 8.8 oz (76 kg)     Diet Order:   NPO  Skin:   reviewed   Height:   Ht Readings from Last 1 Encounters:  04/12/2015 5\' 5"  (1.651 m)    Weight:   Wt Readings from Last 1 Encounters:  05/17/15 167 lb 8.8 oz (76 kg)     BMI:  Body mass index is 27.88 kg/(m^2).  Estimated Nutritional Needs:   Kcal:  1846 kcals (BEE: 1466, Ve: 12, Tmax: 37.8) using current wt of 74.5  Protein:  89-149 g (1.2-2.0 g/kg)   Fluid:  1875-2250 mL (25-30 ml/kg)   EDUCATION NEEDS:   No education needs identified at this time  HIGH Care Level  Judy Goodenow B. Freida BusmanAllen, RD, LDN [phone] (pager)

## 2015-05-17 NOTE — Progress Notes (Signed)
eLink Physician-Brief Progress Note Patient Name: Noni SaupeRobin Garceau DOB: Oct 28, 1975 MRN: [id number]   Date of Service  05/17/2015  HPI/Events of Note  ABG on 60%/PRVC 26/TV 350/P 16 = 7.34/96/65/51.8  eICU Interventions  Will order: 1. Increase rate to 28. 2. ABG at 6 PM.     Intervention Category Major Interventions: Respiratory failure - evaluation and management  Taneeka Curtner Eugene 05/17/2015, 4:35 PM

## 2015-05-17 NOTE — Progress Notes (Signed)
Pt profile: 39 F with multiple previous suicide attempts took large OD of bupropion 10/07 c/b acute resp failure, protracted seizures, shock. Improved 10/10 and able to F/C intermittently. Failed extubation 10/10 with pulm edema pattern on CXR after re-intubation, still remains intubated,sedated high vent settings, failure to wean from vent  Family discussion held: plan for comfort care measures at end of week  Active problems: VDRF--ARDS.  Pulm edema Possible aspiration Failed extubation 10/10 Cardiomyopathy (LVEF 40%) Shock, resolved Hypokalemia Acute encephalopathy Seizures due to bupropion OD   Lines, Tubes, etc: ETT 10/07 >> 10/10, 10/10 >>  R femoral A-line 10/09 >> 10/12 R IJ CVL 10/09 >>    Microbiology: MRSA PCR 10/07 >> NEG Urine 10/08 >> NEG Resp 10/10 >> NOF Blood 10/08 >> NEG PCT 10/11: 4.32, 1.98,   Antibiotics:  Pip-tazo 10/09 >> 10/10 Vanc 10/09 >> 10/13 >>Restart 10/16. Ceftaz 10/11 >>   Studies/Events: 10/08 CT head: normal 10/09 TTE: LVEF 40-45% 10/10 Failed extubation. 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 10/12 Ct head: NAD  Consults:  PCCM 10/08 Neuro 10/08  Best Practice: DVT: LMWH SUP: PPI Nutrition: TFs 10/11 Glycemic control: no SSI indicated Sedation/analgesia: Propofol  Subj: RASS --2 to +2, + F/C intermittent Remains intubated,sedated fio2 60% PEEp at 16  Obj: Filed Vitals:   05/17/15 0800  BP: 121/74  Pulse: 105  Temp:   Resp: 16    Gen: WDWN, RASS -1, intubated HEENT: NCAT, EOMI, PERRL Neck: No JVD Chest: diffuse rhonchi Cardiac: reg, no M Abd: soft, NT Ext: warm, no edema Neuro: MAEs vigorously to stimulation  BMET BMP Latest Ref Rng 05/17/2015 05/16/2015 05/15/2015  Glucose 65 - 99 mg/dL 161(W) 960(A) 540(J)  BUN 6 - 20 mg/dL Creatinine 0.44 - 1.00 mg/dL 8.11(B) 1.47 8.29  Sodium 135 - 145 mmol/L  148(H) 148(H) 149(H)  Potassium 3.5 - 5.1 mmol/L 3.4(L) 2.7(LL) 3.4(L)  Chloride 101 - 111 mmol/L 98(L) 97(L) 104  CO2 22 - 32 mmol/L 44(H) 43(H) 37(H)  Calcium 8.9 - 10.3 mg/dL 8.2(L) 8.1(L) 8.2(L)     CBC CBC Latest Ref Rng 05/16/2015 05/15/2015 05/14/2015  WBC 3.6 - 11.0 K/uL 21.5(H) 22.7(H) 17.1(H)  Hemoglobin 12.0 - 16.0 g/dL 5.6(O) 1.3(Y) 8.1(L)  Hematocrit 35.0 - 47.0 % 27.5(L) 26.2(L) 24.7(L)  Platelets 150 - 440 K/uL 166 191 163      CXR: slightly improved edema/ALI pattern   IMPRESSION:38 yo white female s/p drug OD with acute resp failure with acute cardiomyopathy complciated by ARDS/pneumonia with severe hypoxia and failure to wean from vent  VDRF Failed extubation 10/10; possible ARDS, currently on AC mode,fio2 60%, PEEP 16 pulm edema Likely aspiration Acute lung injury/ARDS Cardiomyopathy - likely toxic Hypokalemia Hypernatremia Rhabdomyolysis - CK improving Lactic acidosis, resolved Elevated LFTs - improving Agitated delirium  PLAN/RECS:  Cont vent support - settings reviewed and/or adjusted-wean fio2 as tolerated Cont vent bundle Daily SBT if/when meets criteria MAP goal > 60 mmHg Monitor BMET intermittently Monitor I/Os Correct electrolytes as indicated Cont SUP and DVT px Cont TFs Cont anticonvulsants per Neuro Will change sedation to propofol.    Family(daughter and SOn) have decided to pursue withdrawal of care and comfort measures, patient would n ot want to live like this and does not want any surgical procedures that will leave scars. Family has explained that patient well being will be compromised and that  she has suffered enough. I have suggested to continue aggressive vent weaning process and attempt another trial of extubation if possible in next 1-2 days.  I have personally obtained a history, examined the patient, evaluated Pertinent laboratory and RadioGraphic/imaging results, and  formulated the assessment and plan   The Patient  requires high complexity decision making for assessment and support, frequent evaluation and titration of therapies, application of advanced monitoring technologies and extensive interpretation of multiple databases. Critical Care Time devoted to patient care services described in this note is 35 minutes.   Overall, patient is critically ill, prognosis is guarded.  Patient with Multiorgan failure and at high risk for cardiac arrest and death.    Lucie LeatherKurian David Amanie Mcculley, M.D.  [facility]  Medical Director [facility] Medical Director [facility]

## 2015-05-17 NOTE — Care Management Note (Signed)
Case Management Note  Patient Details  Name: Kathleen SaupeRobin Gray MRN: [id number] Date of Birth: [birth year]  Subjective/Objective: Family has decided on no trach or PEG. Plan is to extubated Thursday with a DNR in place.                  Action/Plan:   Expected Discharge Date:                  Expected Discharge Plan:     In-House Referral:  Clinical Social Work  Discharge planning Services  CM Consult  Post Acute Care Choice:    Choice offered to:     DME Arranged:    DME Agency:     HH Arranged:    HH Agency:     Status of Service:  In process, will continue to follow  Medicare Important Message Given:    Date Medicare IM Given:    Medicare IM give by:    Date Additional Medicare IM Given:    Additional Medicare Important Message give by:     If discussed at Long Length of Stay Meetings, dates discussed:    Additional Comments:  Marily MemosLisa M Teighlor Korson, RN 05/17/2015, 11:13 AM

## 2015-05-17 NOTE — Progress Notes (Signed)
Patient ID: Kathleen SaupeRobin Gray, female   DOB: [birth year], 39 y.o.   MRN: [id number] [facility]  PCP: No PCP Per Patient  HPI/Subjective: Patient sedated.  Objective: Filed Vitals:   05/17/15 1400  BP: 134/71  Pulse: 126  Temp: 100.6 F (38.1 C)  Resp: 33    Filed Weights   05/15/15 0418 05/16/15 0444 05/17/15 0600  Weight: 77.1 kg (169 lb 15.6 oz) 75.5 kg (166 lb 7.2 oz) 76 kg (167 lb 8.8 oz)    ROS: Review of Systems  Unable to perform ROS  secondary to being on the ventilator and on sedation Exam: Physical Exam  Constitutional:  Patient is sedated.  HENT:  Nose: No mucosal edema.  Unable to look in mouth.  Eyes: Conjunctivae and lids are normal. Pupils are equal, round, and reactive to light.  Neck: No JVD present. Carotid bruit is not present. No edema present. No thyroid mass and no thyromegaly present.  Cardiovascular: S1 normal and S2 normal.  Exam reveals no gallop.   No murmur heard. Pulses:      Dorsalis pedis pulses are 2+ on the right side, and 2+ on the left side.  Respiratory: No respiratory distress. She has no wheezes. She has no rhonchi. She has rales in the right lower field and the left lower field.  GI: Soft. Bowel sounds are normal. There is no tenderness.  Musculoskeletal:       Right ankle: She exhibits swelling.       Left ankle: She exhibits swelling.  Lymphadenopathy:    She has no cervical adenopathy.  Skin: Skin is warm. No rash noted. Nails show no clubbing.  Psychiatric:  No response today sternal rub.    Data Reviewed: Basic Metabolic Panel:  Recent Labs Lab 05/13/15 0456 05/14/15 0414 05/15/15 0510 05/16/15 0512 05/17/15 0502 05/17/15 1130  NA 153* 148* 149* 148* 148*  --   K 3.5 3.2* 3.4* 2.7* 3.4*  --   CL 115* 111 104 97* 98*  --   CO2 31 32 37* 43* 44*  --   GLUCOSE 120* 114* 116* 117* 120*  --   BUN 23* 19 15 14 13   --   CREATININE 0.64 0.48 0.46 0.47 0.42*  --   CALCIUM 8.4* 8.2* 8.2* 8.1*  8.2*  --   MG  --   --   --  1.7 2.1  --   PHOS  --   --   --   --   --  3.1   Liver Function Tests:  Recent Labs Lab 05/11/15 0500 05/12/15 0359 05/13/15 0456 05/14/15 0414 05/17/15 0502  AST 1247* 649* 186* 84* 85*  ALT 1895* 1760* 1172* 710* 233*  ALKPHOS 82 102 100 109 213*  BILITOT 1.1 0.6 0.7 0.6 1.0  PROT 5.4* 5.6* 6.0* 5.4* 5.5*  ALBUMIN 2.5* 2.3* 2.2* 1.9* 1.7*    CBC:  Recent Labs Lab 05/12/15 0359 05/13/15 0456 05/14/15 0414 05/15/15 0510 05/16/15 0512  WBC 17.0* 19.6* 17.1* 22.7* 21.5*  HGB 8.3* 8.3* 8.1* 8.8* 8.7*  HCT 25.6* 25.2* 24.7* 26.2* 27.5*  MCV 90.0 89.2 90.1 88.6 88.6  PLT 140* 139* 163 191 166   Cardiac Enzymes:  Recent Labs Lab 05/11/15 0500 05/13/15 0456 05/15/15 0510  CKTOTAL 5905* 857* 76    CBG:  Recent Labs Lab 05/15/15 1633 05/15/15 2319 05/16/15 0753 05/17/15 0006 05/17/15 0721  GLUCAP 99 90 111* 104* 137*    Recent Results (from the past 240  hour(s))  Culture, respiratory (NON-Expectorated)     Status: None   Collection Time: 05/09/15  3:47 PM  Result Value Ref Range Status   Specimen Description TRACHEAL ASPIRATE  Final   Special Requests NONE  Final   Gram Stain   Final    EXCELLENT SPECIMEN - 90-100% WBCS MODERATE WBC SEEN RARE GRAM POSITIVE COCCI    Culture APPEARS TO BE NORMAL RESPIRATORY FLORA  Final   Report Status 05/11/2015 FINAL  Final  Culture, blood (routine x 2)     Status: None (Preliminary result)   Collection Time: 05/13/15 12:46 PM  Result Value Ref Range Status   Specimen Description BLOOD RIGHT AC  Final   Special Requests BOTTLES DRAWN AEROBIC AND ANAEROBIC  15CC  Final   Culture NO GROWTH 4 DAYS  Final   Report Status PENDING  Incomplete  Culture, blood (routine x 2)     Status: None (Preliminary result)   Collection Time: 05/13/15 12:57 PM  Result Value Ref Range Status   Specimen Description BLOOD RIGHT WRIST  Final   Special Requests   Final    BOTTLES DRAWN AEROBIC AND  ANAEROBIC  AER 15CC ANA 10CC   Culture NO GROWTH 4 DAYS  Final   Report Status PENDING  Incomplete  Urine culture     Status: None   Collection Time: 05/13/15  2:52 PM  Result Value Ref Range Status   Specimen Description URINE, RANDOM  Final   Special Requests NONE  Final   Culture NO GROWTH 2 DAYS  Final   Report Status 05/15/2015 FINAL  Final  Culture, respiratory (NON-Expectorated)     Status: None   Collection Time: 05/13/15  3:16 PM  Result Value Ref Range Status   Specimen Description TRACHEAL ASPIRATE  Final   Special Requests NONE  Final   Gram Stain   Final    MODERATE WBC SEEN NO ORGANISMS SEEN GOOD SPECIMEN - 80-90% WBCS    Culture NO GROWTH 2 DAYS  Final   Report Status 05/15/2015 FINAL  Final  Culture, blood (routine x 2)     Status: None (Preliminary result)   Collection Time: 05/14/15  7:34 PM  Result Value Ref Range Status   Specimen Description BLOOD RIGHT ARM  Final   Special Requests BOTTLES DRAWN AEROBIC AND ANAEROBIC 7CC  Final   Culture NO GROWTH 3 DAYS  Final   Report Status PENDING  Incomplete  Culture, blood (routine x 2)     Status: None (Preliminary result)   Collection Time: 05/14/15  7:38 PM  Result Value Ref Range Status   Specimen Description BLOOD RIGHT ARM  Final   Special Requests BOTTLES DRAWN AEROBIC AND ANAEROBIC 7CC  Final   Culture NO GROWTH 3 DAYS  Final   Report Status PENDING  Incomplete  Urine culture     Status: None   Collection Time: 05/14/15 10:45 PM  Result Value Ref Range Status   Specimen Description URINE, RANDOM  Final   Special Requests NONE  Final   Culture NO GROWTH 2 DAYS  Final   Report Status 05/16/2015 FINAL  Final  Stool culture     Status: None   Collection Time: 05/14/15 10:45 PM  Result Value Ref Range Status   Specimen Description STOOL  Final   Special Requests NONE  Final   Culture   Final    NO SALMONELLA OR SHIGELLA ISOLATED NO CAMPYLOBACTER DETECTED No Pathogenic E. coli detected REDUCED NORMAL  FECAL FLORA  Report Status 05/17/2015 FINAL  Final  C difficile quick scan w PCR reflex     Status: None   Collection Time: 05/15/15 11:08 PM  Result Value Ref Range Status   C Diff antigen NEGATIVE NEGATIVE Final   C Diff toxin NEGATIVE NEGATIVE Final   C Diff interpretation Negative for C. difficile  Final     Studies: Dg Chest 1 View  05/16/2015  CLINICAL DATA:  Dyspnea. EXAM: CHEST 1 VIEW COMPARISON:  Chest radiograph from one day prior. FINDINGS: Endotracheal tube tip is 2.5 cm above the carina. Enteric tube enters the stomach, with the tip not seen on this image. Right internal jugular central venous catheter terminates over the right atrium. No pneumothorax. No appreciable pleural effusion. Severe diffuse fluffy airspace opacities throughout both lungs appears worsened in the interval, particularly in the left upper lobe. IMPRESSION: Severe diffuse fluffy airspace opacities throughout both lungs, with interval worsening, differential includes ARDS, multifocal pneumonia and/or diffuse alveolar hemorrhage. Electronically Signed   By: Delbert Phenix M.D.   On: 05/16/2015 07:28    Scheduled Meds: . acetaminophen (TYLENOL) oral liquid 160 mg/5 mL  650 mg Per Tube 4 times per day  . antiseptic oral rinse  7 mL Mouth Rinse QID  . chlorhexidine gluconate  15 mL Mouth Rinse BID  . enoxaparin (LOVENOX) injection  40 mg Subcutaneous Q24H  . free water  200 mL Per Tube Q4H  . levETIRAcetam  500 mg Intravenous Q12H  . levothyroxine  75 mcg Intravenous Daily  . pantoprazole sodium  40 mg Per Tube Q1200  . potassium chloride  10 mEq Intravenous Q1 Hr x 2   Continuous Infusions: . feeding supplement (VITAL HIGH PROTEIN) 1,000 mL (05/17/15 0800)  . fentaNYL infusion INTRAVENOUS 300 mcg/hr (05/17/15 0800)  . propofol (DIPRIVAN) infusion 65 mcg/kg/min (05/17/15 1356)    Assessment/Plan:  1. 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 Metabolic encephalopathy. Patient is on Keppra and  neurology is following. Last EEG showed diffuse slowing but no active seizure. Repeat CT scan negative. 2. Ventricular tachycardia cardiac arrest and lactic acidosis 3. Aspiration pneumonia, high fever- antibiotics stopped wondering if this is a drug fever. All cultures are negative 4. Acute respiratory failure with hypoxia, ARDS on chest x-ray. Full vent support at this time, now requiring 60% oxygen. Family against trach and PEG. Family will likely do comfort care and extubation potentially on Thursday. 5. Acute systolic congestive heart failure and fluid overload-  Intermittent IV Lasix. 6. Hypokalemia- electrolyte protocol 7. Anemia- continue to monitor 7.   Rhabdomyolysis- patient's CPK normalized. Likely secondary to status epilepticus. 8.   Shock liver- liver functions trended better 9.   Diarrhea- stool studies negative and C. difficile negative. 10. Hypernatremia- free water via the tube. 11. Anxiety depression and suicide attempt.  Code Status:     Code Status Orders        Start     Ordered   05/15/2015 2311  Full code   Continuous     05/15/2015 2310     Family Communication: Son at bedside Disposition Plan: To be determined  Consultants:  Critical care specialist  Neurology  Antibiotics:  Vancomycin  Ceftazidime  Time spent:   Alford Highland  [facility]

## 2015-05-18 LAB — CULTURE, BLOOD (ROUTINE X 2)
CULTURE: NO GROWTH
CULTURE: NO GROWTH

## 2015-05-18 MED ORDER — STERILE WATER FOR INJECTION IJ SOLN
INTRAMUSCULAR | Status: AC
  Administered 2015-05-18: 10 mL
  Filled 2015-05-18: qty 10

## 2015-05-18 MED ORDER — FUROSEMIDE 10 MG/ML IJ SOLN: 40.0000 mg | Freq: Once | INTRAMUSCULAR | Status: DC

## 2015-05-18 MED ORDER — STERILE WATER FOR INJECTION IJ SOLN
INTRAMUSCULAR | Status: AC
  Administered 2015-05-18: 10:00:00
  Filled 2015-05-18: qty 10

## 2015-05-18 NOTE — Progress Notes (Signed)
Palliative Medicine Inpatient Consult Follow Up Note   Name: Kathleen SaupeRobin Gray Date: 05/18/2015 MRN: [id number]  DOB: January 04, 1976  Referring Physician: Alford Highlandichard Wieting, MD  Palliative Care consult requested for this 39 y.o. female for goals of medical therapy in patient with acute respiratory failure.  Other problems as listed under IMPRESSION.  TODAY'S DISCUSSIONS, DECISIIONS, AND EVENTS: Pt had a planned extubation for 2:30 pm. The family was in agreement. Many visitors came initially, but closer to extubation time, the two children plus two other family members were the ones who remained in the room. Chaplain was nearby. Several nurses and RT were helpful during this sad time.    Pt is not felt to have a reasonable chance of recovery. She had been so consistently sedated, however, that I felt it incumbent to reduce/ limit sedating meds near the time of extubation so that we could give her a chance to see if she was going to breath off the vent.  She was extubated to nasal cannula oxygen. She immediately showed severe signs of respiratory distress and failure, and it was readily apparent that she was actively dying and was not going to tolerate living off of a ventilator at this time. As family knew it would be her wish to not live a prolonged time maintained on machines and they knew she would not want a trach or PEG, they felt very comfortable with this plan --though they were deeply saddened.  She then required a lot of medication to treat her agonal respiratory pattern and air hunger. See orders for details.  At 3:26 pm, she went into PEA rhythm and by 3:30, this was asystolic on monitor.  Family was bedside and aware.    This is an ME case as pt died by suicide as underlying cause.  Pt was extubated about an hour before dying.  All other tubes etc were left in place per nursing.  Attending was notified by nursing.    IMPRESSION: 1. Suicide --underlying cause of death --- by overdose of Wellbutrin  100 tablets ---with poisoning by Wellbutrin --not taken as intended ---with h/o suicide attempt recently by putting a hose on exhaust tailpipe of a car ---h/o suicide attempt in January 2. Acute hypoxic and hypercapneic respiratory failure ---due to suicide attempt and effects of meds and ARDS  ---intubated, on 60% O2, high PEEP, breathing over the vent at high rates 3. Seizures --due to lowered seizure threshold and overdose effect ---with some random movements of arms today 4. H/O depression and PTSD ---related to history of abuse, alcohol and drug abuse 5. DM2 6. Hypothyroidism 7. Polycystic ovary syndrome 8. HCAP/ Aspiration Pneumonia 9. ARDS 10. Cardiomyopathy with EF 40%  --with Acute systolic CHF 11. Elevated liver enzymes and shock liver  12. Electrolyte dissaray and hypkalemia 13. Rhabdomyolysis from seizures 14. V TAch and PEA arrest 15. Shock --hypovolemic and cardiogenic 16. Anemia of critical illness 17. Hypernatremia 18. Metabolic encephalopathy and suspected metabolic encephalopathy 19. Lactic Acidosis --resolved 20. Failed extubation 10/10 21. Hypoalbuminemia  REVIEW OF SYSTEMS:  Patient is not able to provide ROS due to dying process  CODE STATUS: DNR   PAST MEDICAL HISTORY: Past Medical History  Diagnosis Date  . Hypothyroidism   . Depression   . Anxiety     PAST SURGICAL HISTORY:  Past Surgical History  Procedure Laterality Date  . Cosmetic surgery      Vital Signs: BP 130/73 mmHg  Pulse 112  Temp(Src) 101.3 F (38.5 C) (Rectal)  Resp  24  Ht  (1.651 m)  Wt 76 kg (167 lb 8.8 oz)  BMI 27.88 kg/m2  SpO2 94%  LMP  (LMP Unknown) Filed Weights   05/15/15 0418 05/16/15 0444 05/17/15 0600  Weight: 77.1 kg (169 lb 15.6 oz) 75.5 kg (166 lb 7.2 oz) 76 kg (167 lb 8.8 oz)    Estimated body mass index is 27.88 kg/(m^2) as calculated from the following:   Height as of this encounter:  (1.651 m).   Weight as of this  encounter: 76 kg (167 lb 8.8 oz).  PHYSICAL EXAM: NAD  At first on vent Then off vent with agonal respirations for nearly one hour post extubation ---with symptoms managed with morphine and fentanyl drips and prn meds No JVD Hrt rrr at first--but tachy Lungs --once extubated she had ronchi, frothy secretions suctioned and agonal respirations Abd soft nt Ext pale and cool   LABS: CBC:    Component Value Date/Time   WBC 21.5* 05/16/2015 0512   WBC 8.6 08/23/2014 0337   HGB 8.7* 05/16/2015 0512   HGB 14.1 08/23/2014 0337   HCT 27.5* 05/16/2015 0512   HCT 42.3 08/23/2014 0337   PLT 166 05/16/2015 0512   PLT 249 08/23/2014 0337   MCV 88.6 05/16/2015 0512   MCV 88 08/23/2014 0337   NEUTROABS 17.6* 05/08/2015 0508   LYMPHSABS 0.9* 05/08/2015 0508   MONOABS 0.8 05/08/2015 0508   EOSABS 0.1 05/08/2015 0508   BASOSABS 0.1 05/08/2015 0508   Comprehensive Metabolic Panel:    Component Value Date/Time   NA 148* 05/17/2015 0502   NA 145 08/23/2014 0337   K 3.4* 05/17/2015 0502   K 3.6 08/23/2014 0337   CL 98* 05/17/2015 0502   CL 110* 08/23/2014 0337   CO2 44* 05/17/2015 0502   CO2 26 08/23/2014 0337   BUN 13 05/17/2015 0502   BUN 7 08/23/2014 0337   CREATININE 0.42* 05/17/2015 0502   CREATININE 0.80 08/23/2014 0337   GLUCOSE 120* 05/17/2015 0502   GLUCOSE 73 08/23/2014 0337   CALCIUM 8.2* 05/17/2015 0502   CALCIUM 9.2 08/23/2014 0337   AST 85* 05/17/2015 0502   AST 28 08/23/2014 0337   ALT 233* 05/17/2015 0502   ALT 22 08/23/2014 0337   ALKPHOS 213* 05/17/2015 0502   ALKPHOS 57 08/23/2014 0337   BILITOT 1.0 05/17/2015 0502   BILITOT 0.5 08/23/2014 0337   PROT 5.5* 05/17/2015 0502   PROT 7.5 08/23/2014 0337   ALBUMIN 1.7* 05/17/2015 0502   ALBUMIN 4.0 08/23/2014 0337     More than 50% of the visit was spent in counseling/coordination of care: YES  Time Spent:  100 min

## 2015-05-18 NOTE — Progress Notes (Signed)
Poison Control called for update on pt. Will call again Friday for further update.

## 2015-05-18 NOTE — Progress Notes (Signed)
Nutrition Brief Note  Chart reviewed. Pt now transitioning to comfort care with plans for terminal extubation on Thursday.  Tube feeding has been stopped. Palliative care following.  No further nutrition interventions warranted at this time.  Please re-consult as needed.   Oprah Camarena B. Freida BusmanAllen, RD, LDN [phone] (pager)

## 2015-05-18 NOTE — Progress Notes (Signed)
Patient ID: Kathleen Gray, female   DOB: 1976-06-09, 39 y.o.   MRN: [id number] [facility] PROGRESS NOTE  PCP: No PCP Per Patient  HPI/Subjective: Patient sedated.  Objective: Filed Vitals:   05/18/15 1200  BP: 140/81  Pulse: 122  Temp: 101.3 F (38.5 C)  Resp: 26    Filed Weights   05/15/15 0418 05/16/15 0444 05/17/15 0600  Weight: 77.1 kg (169 lb 15.6 oz) 75.5 kg (166 lb 7.2 oz) 76 kg (167 lb 8.8 oz)    ROS: Review of Systems  Unable to perform ROS  secondary to being on the ventilator and on sedation Exam: Physical Exam  Constitutional:  Patient is sedated.  HENT:  Nose: No mucosal edema.  Unable to look in mouth.  Eyes: Conjunctivae and lids are normal. Pupils are equal, round, and reactive to light.  Neck: No JVD present. Carotid bruit is not present. No edema present. No thyroid mass and no thyromegaly present.  Cardiovascular: S1 normal and S2 normal.  Exam reveals no gallop.   No murmur heard. Pulses:      Dorsalis pedis pulses are 2+ on the right side, and 2+ on the left side.  Respiratory: No respiratory distress. She has no wheezes. She has no rhonchi. She has rales in the right lower field and the left lower field.  GI: Soft. Bowel sounds are normal. There is no tenderness.  Musculoskeletal:       Right ankle: She exhibits swelling.       Left ankle: She exhibits swelling.  Lymphadenopathy:    She has no cervical adenopathy.  Skin: Skin is warm. No rash noted. Nails show no clubbing.  Psychiatric:  No response today sternal rub.    Data Reviewed: Basic Metabolic Panel:  Recent Labs Lab 05/13/15 0456 05/14/15 0414 05/15/15 0510 05/16/15 0512 05/17/15 0502 05/17/15 1130  NA 153* 148* 149* 148* 148*  --   K 3.5 3.2* 3.4* 2.7* 3.4*  --   CL 115* 111 104 97* 98*  --   CO2 31 32 37* 43* 44*  --   GLUCOSE 120* 114* 116* 117* 120*  --   BUN 23* --   CREATININE 0.64 0.48 0.46 0.47 0.42*  --   CALCIUM 8.4* 8.2* 8.2* 8.1*  8.2*  --   MG  --   --   --  1.7 2.1  --   PHOS  --   --   --   --   --  3.1   Liver Function Tests:  Recent Labs Lab 05/12/15 0359 05/13/15 0456 05/14/15 0414 05/17/15 0502  AST 649* 186* 84* 85*  ALT 1760* 1172* 710* 233*  ALKPHOS 102 100 109 213*  BILITOT 0.6 0.7 0.6 1.0  PROT 5.6* 6.0* 5.4* 5.5*  ALBUMIN 2.3* 2.2* 1.9* 1.7*    CBC:  Recent Labs Lab 05/12/15 0359 05/13/15 0456 05/14/15 0414 05/15/15 0510 05/16/15 0512  WBC 17.0* 19.6* 17.1* 22.7* 21.5*  HGB 8.3* 8.3* 8.1* 8.8* 8.7*  HCT 25.6* 25.2* 24.7* 26.2* 27.5*  MCV 90.0 89.2 90.1 88.6 88.6  PLT 140* 139* 163 191 166   Cardiac Enzymes:  Recent Labs Lab 05/13/15 0456 05/15/15 0510  CKTOTAL 857* 76    CBG:  Recent Labs Lab 05/15/15 2319 05/16/15 0753 05/17/15 0006 05/17/15 0721 05/17/15 1602  GLUCAP 90 111* 104* 137* 146*    Recent Results (from the past 240 hour(s))  Culture, respiratory (NON-Expectorated)     Status: None  Collection Time: 05/09/15  3:47 PM  Result Value Ref Range Status   Specimen Description TRACHEAL ASPIRATE  Final   Special Requests NONE  Final   Gram Stain   Final    EXCELLENT SPECIMEN - 90-100% WBCS MODERATE WBC SEEN RARE GRAM POSITIVE COCCI    Culture APPEARS TO BE NORMAL RESPIRATORY FLORA  Final   Report Status 05/11/2015 FINAL  Final  Culture, blood (routine x 2)     Status: None   Collection Time: 05/13/15 12:46 PM  Result Value Ref Range Status   Specimen Description BLOOD RIGHT AC  Final   Special Requests BOTTLES DRAWN AEROBIC AND ANAEROBIC  15CC  Final   Culture NO GROWTH 5 DAYS  Final   Report Status 05/18/2015 FINAL  Final  Culture, blood (routine x 2)     Status: None   Collection Time: 05/13/15 12:57 PM  Result Value Ref Range Status   Specimen Description BLOOD RIGHT WRIST  Final   Special Requests   Final    BOTTLES DRAWN AEROBIC AND ANAEROBIC  AER 15CC ANA 10CC   Culture NO GROWTH 5 DAYS  Final   Report Status 05/18/2015 FINAL  Final   Urine culture     Status: None   Collection Time: 05/13/15  2:52 PM  Result Value Ref Range Status   Specimen Description URINE, RANDOM  Final   Special Requests NONE  Final   Culture NO GROWTH 2 DAYS  Final   Report Status 05/15/2015 FINAL  Final  Culture, respiratory (NON-Expectorated)     Status: None   Collection Time: 05/13/15  3:16 PM  Result Value Ref Range Status   Specimen Description TRACHEAL ASPIRATE  Final   Special Requests NONE  Final   Gram Stain   Final    MODERATE WBC SEEN NO ORGANISMS SEEN GOOD SPECIMEN - 80-90% WBCS    Culture NO GROWTH 2 DAYS  Final   Report Status 05/15/2015 FINAL  Final  Culture, blood (routine x 2)     Status: None (Preliminary result)   Collection Time: 05/14/15  7:34 PM  Result Value Ref Range Status   Specimen Description BLOOD RIGHT ARM  Final   Special Requests BOTTLES DRAWN AEROBIC AND ANAEROBIC 7CC  Final   Culture NO GROWTH 4 DAYS  Final   Report Status PENDING  Incomplete  Culture, blood (routine x 2)     Status: None (Preliminary result)   Collection Time: 05/14/15  7:38 PM  Result Value Ref Range Status   Specimen Description BLOOD RIGHT ARM  Final   Special Requests BOTTLES DRAWN AEROBIC AND ANAEROBIC 7CC  Final   Culture NO GROWTH 4 DAYS  Final   Report Status PENDING  Incomplete  Urine culture     Status: None   Collection Time: 05/14/15 10:45 PM  Result Value Ref Range Status   Specimen Description URINE, RANDOM  Final   Special Requests NONE  Final   Culture NO GROWTH 2 DAYS  Final   Report Status 05/16/2015 FINAL  Final  Stool culture     Status: None   Collection Time: 05/14/15 10:45 PM  Result Value Ref Range Status   Specimen Description STOOL  Final   Special Requests NONE  Final   Culture   Final    NO SALMONELLA OR SHIGELLA ISOLATED NO CAMPYLOBACTER DETECTED No Pathogenic E. coli detected REDUCED NORMAL FECAL FLORA    Report Status 05/17/2015 FINAL  Final  C difficile quick scan w PCR  reflex      Status: None   Collection Time: 05/15/15 11:08 PM  Result Value Ref Range Status   C Diff antigen NEGATIVE NEGATIVE Final   C Diff toxin NEGATIVE NEGATIVE Final   C Diff interpretation Negative for C. difficile  Final      Scheduled Meds: . acetaminophen (TYLENOL) oral liquid 160 mg/5 mL  650 mg Per Tube 4 times per day  . antiseptic oral rinse  7 mL Mouth Rinse QID  . furosemide  40 mg Intravenous Once  . levETIRAcetam  500 mg Intravenous Q12H  . pantoprazole sodium  40 mg Per Tube Q1200  . sterile water (preservative free)       Continuous Infusions: . fentaNYL infusion INTRAVENOUS 400 mcg/hr (05/18/15 1144)  . propofol (DIPRIVAN) infusion 65.285 mcg/kg/min (05/18/15 1129)    Assessment/Plan:  1. 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 Metabolic encephalopathy. Patient is on Keppra. Last EEG showed diffuse slowing but no active seizure. Repeat CT scan negative. 2. Ventricular tachycardia cardiac arrest and lactic acidosis 3. Aspiration pneumonia. Still spiking fever. All cultures are negative. Antibiotics stopped. 4. Acute respiratory failure with hypoxia, ARDS on chest x-ray. Full vent support at this time, now requiring 60% oxygen. Children against trach and PEG. Likely terminal extubation on Thursday afternoon. 5. Acute systolic congestive heart failure and fluid overload-  IV Lasix tomorrow am 6. Hypokalemia- no further lab draws 7. Anemia- no further lab draws 7.   Rhabdomyolysis- patient's CPK normalized. Likely secondary to status epilepticus. 8.   Shock liver- liver functions trended better but still slightly elevated 9.   Diarrhea- stool studies negative and C. difficile negative. 10. Hypernatremia- tube feedings stopped by palliative care team. 11. Anxiety depression and suicide attempt.  Code Status:     Code Status Orders        Start     Ordered   05/04/2015 2311  Full code   Continuous     05/05/2015 2310     Family Communication: Son  yesterday Disposition Plan: Likely terminal extubation Thursday afternoon.  Consultants:  Critical care specialist  Neurology  Time spent: 18minutes   Alford HighlandWIETING, [place] Santa  [facility]

## 2015-05-18 NOTE — Progress Notes (Signed)
Pt profile: 39 F with multiple previous suicide attempts took large OD of bupropion 10/07 c/b acute resp failure, protracted seizures, shock. Improved 10/10 and able to F/C intermittently. Failed extubation 10/10 with pulm edema pattern on CXR after re-intubation, still remains intubated,sedated high vent settings, failure to wean from vent  Family discussion held: plan for comfort care measures at end of week-plan for comfort care measures Thursday at 230PM Palliative Care Team Dr Orvan Falconer discussed with family and all labs adn TF's have been stopped. Continue sedation as needed  Active problems: VDRF--ARDS.  Pulm edema Possible aspiration Failed extubation 10/10 Cardiomyopathy (LVEF 40%) Shock, resolved Hypokalemia Acute encephalopathy Seizures due to bupropion OD   Lines, Tubes, etc: ETT 10/07 >> 10/10, 10/10 >>  R femoral A-line 10/09 >> 10/12 R IJ CVL 10/09 >>    Microbiology: MRSA PCR 10/07 >> NEG Urine 10/08 >> NEG Resp 10/10 >> NOF Blood 10/08 >> NEG PCT 10/11: 4.32, 1.98,   Antibiotics:  Pip-tazo 10/09 >> 10/10 Vanc 10/09 >> 10/13 >>Restart 10/16. Ceftaz 10/11 >>   Studies/Events: 10/08 CT head: normal 10/09 TTE: LVEF 40-45% 10/10 Failed extubation. 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 10/12 Ct head: NAD  Consults:  PCCM 10/08 Neuro 10/08  Best Practice: DVT: LMWH SUP: PPI Nutrition: TFs 10/11 Glycemic control: no SSI indicated Sedation/analgesia: Propofol  Subj: RASS --2 to +2, + F/C intermittent Remains intubated,sedated fio2 60% PEEp at 16  Obj: Filed Vitals:   05/18/15 0700  BP: 129/73  Pulse: 109  Temp:   Resp: 28    Gen: WDWN, RASS -1, intubated HEENT: NCAT, EOMI, PERRL Neck: No JVD Chest: diffuse rhonchi Cardiac: reg, no M Abd: soft, NT Ext: warm, no edema Neuro: MAEs vigorously to stimulation  BMET BMP Latest Ref Rng  05/17/2015 05/16/2015 05/15/2015  Glucose 65 - 99 mg/dL 045(W) 098(J) 191(Y)  BUN 6 - 20 mg/dL Creatinine 0.44 - 1.00 mg/dL 7.82(N) 5.62 1.30  Sodium 135 - 145 mmol/L 148(H) 148(H) 149(H)  Potassium 3.5 - 5.1 mmol/L 3.4(L) 2.7(LL) 3.4(L)  Chloride 101 - 111 mmol/L 98(L) 97(L) 104  CO2 22 - 32 mmol/L 44(H) 43(H) 37(H)  Calcium 8.9 - 10.3 mg/dL 8.2(L) 8.1(L) 8.2(L)     CBC CBC Latest Ref Rng 05/16/2015 05/15/2015 05/14/2015  WBC 3.6 - 11.0 K/uL 21.5(H) 22.7(H) 17.1(H)  Hemoglobin 12.0 - 16.0 g/dL 8.6(V) 7.8(I) 8.1(L)  Hematocrit 35.0 - 47.0 % 27.5(L) 26.2(L) 24.7(L)  Platelets 150 - 440 K/uL 166 191 163      CXR: slightly improved edema/ALI pattern   IMPRESSION:38 yo white female s/p drug OD with acute resp failure with acute cardiomyopathy complciated by ARDS/pneumonia with severe hypoxia and failure to wean from vent  VDRF Failed extubation 10/10; possible ARDS, currently on AC mode,fio2 60%, PEEP 16 pulm edema Likely aspiration Acute lung injury/ARDS Cardiomyopathy - likely toxic Hypokalemia Hypernatremia Rhabdomyolysis - CK improving Lactic acidosis, resolved Elevated LFTs - improving Agitated delirium  PLAN/RECS:  Cont vent support - settings reviewed and/or adjusted-wean fio2 as tolerated Cont vent bundle MAP goal > 60 mmHg Cont SUP and DVT px  sedation to propofol/fentanyl.    Family(daughter and SOn) have decided to pursue withdrawal of care and comfort measures, patient would NOT want to live like this and does not want any surgical procedures that will leave scars. Family has explained that patient well being  will be compromised and that she has suffered enough.  Plan for Comfort Care Measures tomorrow(Thursday) at 230 PM  I have personally obtained a history, examined the patient, evaluated Pertinent laboratory and RadioGraphic/imaging results, and  formulated the assessment and plan   The Patient requires high complexity decision making for  assessment and support, frequent evaluation and titration of therapies, application of advanced monitoring technologies and extensive interpretation of multiple databases. Critical Care Time devoted to patient care services described in this note is 35 minutes.   Overall, patient is critically ill, prognosis is guarded.  Patient with Multiorgan failure and at high risk for cardiac arrest and death.    Lucie LeatherKurian David Ras Kollman, M.D.  [facility]  Medical Director [facility] Medical Director [facility] Cardio[facility] Department

## 2015-05-19 ENCOUNTER — Telehealth: Payer: Self-pay | Admitting: Licensed Clinical Social Worker

## 2015-05-19 LAB — CULTURE, BLOOD (ROUTINE X 2)
CULTURE: NO GROWTH
CULTURE: NO GROWTH

## 2015-05-19 LAB — BLOOD GAS, ARTERIAL
ACID-BASE EXCESS: 24.3 mmol/L — AB (ref 0.0–3.0)
ALLENS TEST (PASS/FAIL): POSITIVE — AB
Bicarbonate: 53.7 mEq/L — ABNORMAL HIGH (ref 21.0–28.0)
FIO2: 0.65
LHR: 28 {breaths}/min
O2 SAT: 98 %
PATIENT TEMPERATURE: 37
PCO2 ART: 79 mmHg — AB (ref 32.0–48.0)
PEEP: 16 cmH2O
PH ART: 7.44 (ref 7.350–7.450)
PO2 ART: 100 mmHg (ref 83.0–108.0)
VT: 350 mL

## 2015-05-19 MED ORDER — LORAZEPAM 2 MG/ML IJ SOLN: 2.0000 mg | INTRAMUSCULAR | Status: DC | PRN

## 2015-05-19 MED ORDER — LORAZEPAM 2 MG/ML IJ SOLN
1.0000 mg | INTRAMUSCULAR | Status: DC | PRN
  Filled 2015-05-19: qty 1

## 2015-05-19 MED ORDER — MORPHINE BOLUS VIA INFUSION
5.0000 mg | INTRAVENOUS | Status: DC | PRN
  Administered 2015-05-19: 5 mg via INTRAVENOUS
  Filled 2015-05-19: qty 5

## 2015-05-19 MED ORDER — FENTANYL 2500MCG IN NS 250ML (10MCG/ML) PREMIX INFUSION: 50.0000 ug/h | INTRAVENOUS | Status: DC

## 2015-05-19 MED ORDER — MORPHINE BOLUS VIA INFUSION
4.0000 mg | INTRAVENOUS | Status: DC | PRN
  Administered 2015-05-19: 4 mg via INTRAVENOUS
  Filled 2015-05-19: qty 4

## 2015-05-19 MED ORDER — MORPHINE 100MG IN NS 100ML (1MG/ML) PREMIX INFUSION
35.0000 mg/h | INTRAVENOUS | Status: DC
  Administered 2015-05-19: 10 mg/h via INTRAVENOUS
  Filled 2015-05-19: qty 100

## 2015-05-19 MED ORDER — LORAZEPAM 2 MG/ML IJ SOLN
2.0000 mg | INTRAMUSCULAR | Status: DC | PRN
  Administered 2015-05-19: 2 mg via INTRAVENOUS
  Filled 2015-05-19: qty 1

## 2015-05-25 NOTE — Telephone Encounter (Signed)
Client has not shown for recent therapy sessions or medication management appointments with Dr. Mayford KnifeWilliams. She has not responded to Psychotherapeutic Services regarding referral to [facility]. Call to check in with client and assess and provide assistance.  DELAYED ENTRY:  Client expired as inpatient at [facility] on 05/03/2015.

## 2015-05-31 NOTE — Progress Notes (Signed)
Dr Belia HemanKasa at bedside, RN notified of tempt rectal greater than 102, MD states to d/c monitoring tempt and all meds except sedation to keep her comfortable and keppra.

## 2015-05-31 NOTE — Progress Notes (Signed)
Patient ID: Kathleen Gray, female   DOB: 04-Sep-1975, 39 y.o.   MRN: [id number] [facility] PROGRESS NOTE  PCP: No PCP Per Patient  HPI/Subjective: Patient sedated.  Objective: Filed Vitals:   Jun 11, 2015 1000  BP: 137/82  Pulse: 110  Temp:   Resp: 26    Filed Weights   05/15/15 0418 05/16/15 0444 05/17/15 0600  Weight: 77.1 kg (169 lb 15.6 oz) 75.5 kg (166 lb 7.2 oz) 76 kg (167 lb 8.8 oz)    ROS: Review of Systems  Unable to perform ROS  secondary to being on the ventilator and on sedation Exam: Physical Exam  Constitutional:  Patient is sedated.  HENT:  Nose: No mucosal edema.  Unable to look in mouth.  Eyes: Conjunctivae and lids are normal. Pupils are equal, round, and reactive to light.  Neck: No JVD present. Carotid bruit is not present. No edema present. No thyroid mass and no thyromegaly present.  Cardiovascular: S1 normal and S2 normal.  Exam reveals no gallop.   No murmur heard. Pulses:      Dorsalis pedis pulses are 2+ on the right side, and 2+ on the left side.  Respiratory: No respiratory distress. She has no wheezes. She has no rhonchi. She has rales in the right lower field and the left lower field.  GI: Soft. Bowel sounds are normal. There is no tenderness.  Musculoskeletal:       Right ankle: She exhibits swelling.       Left ankle: She exhibits swelling.  Lymphadenopathy:    She has no cervical adenopathy.  Skin: Skin is warm. No rash noted. Nails show no clubbing.  Psychiatric:  No response today sternal rub.    Data Reviewed: Basic Metabolic Panel:  Recent Labs Lab 05/13/15 0456 05/14/15 0414 05/15/15 0510 05/16/15 0512 05/17/15 0502 05/17/15 1130  NA 153* 148* 149* 148* 148*  --   K 3.5 3.2* 3.4* 2.7* 3.4*  --   CL 115* 111 104 97* 98*  --   CO2 31 32 37* 43* 44*  --   GLUCOSE 120* 114* 116* 117* 120*  --   BUN 23* --   CREATININE 0.64 0.48 0.46 0.47 0.42*  --   CALCIUM 8.4* 8.2* 8.2* 8.1* 8.2*  --   MG  --    --   --  1.7 2.1  --   PHOS  --   --   --   --   --  3.1   Liver Function Tests:  Recent Labs Lab 05/13/15 0456 05/14/15 0414 05/17/15 0502  AST 186* 84* 85*  ALT 1172* 710* 233*  ALKPHOS 100 109 213*  BILITOT 0.7 0.6 1.0  PROT 6.0* 5.4* 5.5*  ALBUMIN 2.2* 1.9* 1.7*    CBC:  Recent Labs Lab 05/13/15 0456 05/14/15 0414 05/15/15 0510 05/16/15 0512  WBC 19.6* 17.1* 22.7* 21.5*  HGB 8.3* 8.1* 8.8* 8.7*  HCT 25.2* 24.7* 26.2* 27.5*  MCV 89.2 90.1 88.6 88.6  PLT 139* 163 191 166   Cardiac Enzymes:  Recent Labs Lab 05/13/15 0456 05/15/15 0510  CKTOTAL 857* 76    CBG:  Recent Labs Lab 05/15/15 2319 05/16/15 0753 05/17/15 0006 05/17/15 0721 05/17/15 1602  GLUCAP 90 111* 104* 137* 146*    Recent Results (from the past 240 hour(s))  Culture, respiratory (NON-Expectorated)     Status: None   Collection Time: 05/09/15  3:47 PM  Result Value Ref Range Status   Specimen Description  TRACHEAL ASPIRATE  Final   Special Requests NONE  Final   Gram Stain   Final    EXCELLENT SPECIMEN - 90-100% WBCS MODERATE WBC SEEN RARE GRAM POSITIVE COCCI    Culture APPEARS TO BE NORMAL RESPIRATORY FLORA  Final   Report Status 05/11/2015 FINAL  Final  Culture, blood (routine x 2)     Status: None   Collection Time: 05/13/15 12:46 PM  Result Value Ref Range Status   Specimen Description BLOOD RIGHT AC  Final   Special Requests BOTTLES DRAWN AEROBIC AND ANAEROBIC  15CC  Final   Culture NO GROWTH 5 DAYS  Final   Report Status 05/18/2015 FINAL  Final  Culture, blood (routine x 2)     Status: None   Collection Time: 05/13/15 12:57 PM  Result Value Ref Range Status   Specimen Description BLOOD RIGHT WRIST  Final   Special Requests   Final    BOTTLES DRAWN AEROBIC AND ANAEROBIC  AER 15CC ANA 10CC   Culture NO GROWTH 5 DAYS  Final   Report Status 05/18/2015 FINAL  Final  Urine culture     Status: None   Collection Time: 05/13/15  2:52 PM  Result Value Ref Range Status    Specimen Description URINE, RANDOM  Final   Special Requests NONE  Final   Culture NO GROWTH 2 DAYS  Final   Report Status 05/15/2015 FINAL  Final  Culture, respiratory (NON-Expectorated)     Status: None   Collection Time: 05/13/15  3:16 PM  Result Value Ref Range Status   Specimen Description TRACHEAL ASPIRATE  Final   Special Requests NONE  Final   Gram Stain   Final    MODERATE WBC SEEN NO ORGANISMS SEEN GOOD SPECIMEN - 80-90% WBCS    Culture NO GROWTH 2 DAYS  Final   Report Status 05/15/2015 FINAL  Final  Culture, blood (routine x 2)     Status: None   Collection Time: 05/14/15  7:34 PM  Result Value Ref Range Status   Specimen Description BLOOD RIGHT ARM  Final   Special Requests BOTTLES DRAWN AEROBIC AND ANAEROBIC 7CC  Final   Culture NO GROWTH 5 DAYS  Final   Report Status June 18, 2015 FINAL  Final  Culture, blood (routine x 2)     Status: None   Collection Time: 05/14/15  7:38 PM  Result Value Ref Range Status   Specimen Description BLOOD RIGHT ARM  Final   Special Requests BOTTLES DRAWN AEROBIC AND ANAEROBIC 7CC  Final   Culture NO GROWTH 5 DAYS  Final   Report Status 06-18-15 FINAL  Final  Urine culture     Status: None   Collection Time: 05/14/15 10:45 PM  Result Value Ref Range Status   Specimen Description URINE, RANDOM  Final   Special Requests NONE  Final   Culture NO GROWTH 2 DAYS  Final   Report Status 05/16/2015 FINAL  Final  Stool culture     Status: None   Collection Time: 05/14/15 10:45 PM  Result Value Ref Range Status   Specimen Description STOOL  Final   Special Requests NONE  Final   Culture   Final    NO SALMONELLA OR SHIGELLA ISOLATED NO CAMPYLOBACTER DETECTED No Pathogenic E. coli detected REDUCED NORMAL FECAL FLORA    Report Status 05/17/2015 FINAL  Final  C difficile quick scan w PCR reflex     Status: None   Collection Time: 05/15/15 11:08 PM  Result Value Ref  Range Status   C Diff antigen NEGATIVE NEGATIVE Final   C Diff toxin  NEGATIVE NEGATIVE Final   C Diff interpretation Negative for C. difficile  Final      Scheduled Meds: . antiseptic oral rinse  7 mL Mouth Rinse QID  . furosemide  40 mg Intravenous Once  . levETIRAcetam  500 mg Intravenous Q12H   Continuous Infusions: . fentaNYL infusion INTRAVENOUS 400 mcg/hr (05/20/2015 0540)  . propofol (DIPRIVAN) infusion 65.062 mcg/kg/min (05/17/2015 0806)    Assessment/Plan:  1. 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 Metabolic encephalopathy. Patient is on Keppra. Last EEG showed diffuse slowing but no active seizure. Repeat CT scan negative. 2. Ventricular tachycardia cardiac arrest and lactic acidosis 3. Aspiration pneumonia. Still spiking fever. All cultures are negative. Antibiotics stopped. Could be a central fever. 4. Acute respiratory failure with hypoxia, ARDS on chest x-ray. Full vent support at this time, now requiring 60% oxygen. Children against trach and PEG. Likely terminal extubation today. 5. Acute systolic congestive heart failure and fluid overload-  1 dose of IV Lasix given this morning. 6. Hypokalemia- no further lab draws 7. Anemia- no further lab draws 7.   Rhabdomyolysis- patient's CPK normalized 8.   Shock liver- liver functions trended better 9.   Diarrhea- stool studies negative and C. difficile negative. 10. Hypernatremia- tube feedings stopped by palliative care team. 11. Anxiety depression and suicide attempt.  Code Status:     Code Status Orders        Start     Ordered   08/22/14 2311  Full code   Continuous     08/22/14 2310     Disposition Plan: Likely terminal extubation this afternoon.  Consultants:  Critical care specialist  Neurology  Time spent: 15 minutes   Alford HighlandWIETING, Claus Silvestro  [facility]

## 2015-05-31 NOTE — Progress Notes (Signed)
RT called to room by Dr. Orvan Falconerampbell to extubate patient. Patient suctioned prior to extubation with little return, extubated to 3LPM [place] with Dr. Orvan Falconerampbell, RN Tram, RN Asher MuirJamie, and 4 family members at bedside.   Patient mouth suctioned post extubation.  Patient comfort care at this time.

## 2015-05-31 NOTE — Progress Notes (Signed)
RN touched base with Dr Orvan Falconerampbell, Dr Orvan Falconerampbell will be here for extubation today.

## 2015-05-31 NOTE — Progress Notes (Signed)
Patient comfort care, terminally extubated around 1430 with respiratory therapist and Dr Orvan Falconerampbell and family at bedside.   This RN and Loistine SimasJohanna, RN as well as Dr Orvan Falconerampbell listened for heart and breath sound.  Dr Orvan Falconerampbell called time of expiration at .  [facility] notified, reference# [id number].  Nurse Supervisor and Dr Hilton SinclairWeiting notified.

## 2015-05-31 NOTE — Discharge Summary (Signed)
[facility] Phys[facility] - [place] at [facility]   PATIENT NAME: Kathleen Gray    MR#:  [id number]  DATE OF BIRTH:  September 24, 1975  DATE OF ADMISSION:  2015-05-13 ADMITTING PHYSICIAN: Altamese Dilling, MD  DATE OF DEATH: 2015/05/26 at 15:26  PRIMARY CARE PHYSICIAN: No PCP Per Patient    ADMISSION DIAGNOSIS:  Intentional overdose of drug in tablet form (HCC) [T50.902A]  DISCHARGE DIAGNOSIS:  Principal Problem:   Status epilepticus (HCC) Active Problems:   Drug overdose   Seizures (HCC)   Intentional overdose of drug in tablet form (HCC)   Respiratory failure requiring intubation (HCC)   Acute respiratory failure with hypoxemia (HCC)   Anoxic brain damage (HCC)   SECONDARY DIAGNOSIS:   Past Medical History  Diagnosis Date  . Hypothyroidism   . Depression   . Anxiety     HOSPITAL COURSE:   1. 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 Metabolic encephalopathy. The patient was on IV Keppra and difficult time getting seizures stopped. The last EEG showed diffuse slowing but no active seizure. The last CT scan was negative. 2. Ventricular tachycardia and cardiac arrest with lactic acidosis 3. Acute respiratory failure with hypoxia, ARDS on chest x-ray- patient was terminally extubated on 2015/05/26 and died at 15:26.  Palliative care had comfort care measures. Family was against trach and PEG and did not want to proceed with this. Patient would've needed long-term on the ventilator and family was against that. 4. Aspiration pneumonia patient was on aggressive antibiotics for good portion of the hospital stay. When patient was made comfort care antibiotics were stopped. All cultures were negative. She was still spiking fever which may mean a central cause of her fever. 5. Acute systolic congestive heart failure and fluid overload patient was given intermittent Lasix during the hospital course 6. Hypokalemia- potassium replaced during the hospital course. 7.  Anemia- patient's hemoglobin drifted down into the 8 range. 8. Rhabdomyolysis-CPK normalized. This was likely secondary to status epilepticus.  9. Shock liver- liver function tests were very high over thousand and then trended better. 10. Diarrhea stool studies and stool for C. difficile were negative. 11 hypernatremia 12 anxiety and depression and suicide attempt.    CONSULTS OBTAINED:  Treatment Team:  Erin Fulling, MD Mellody Drown, MD  DRUG ALLERGIES:  No Known Allergies   DATA REVIEW:   CBC  Recent Labs Lab 05/16/15 0512  WBC 21.5*  HGB 8.7*  HCT 27.5*  PLT 166    Chemistries   Recent Labs Lab 05/17/15 0502  NA 148*  K 3.4*  CL 98*  CO2 44*  GLUCOSE 120*  BUN 13  CREATININE 0.42*  CALCIUM 8.2*  MG 2.1  AST 85*  ALT 233*  ALKPHOS 213*  BILITOT 1.0     Microbiology Results  Results for orders placed or performed during the hospital encounter of May 13, 2015  MRSA PCR Screening     Status: None   Collection Time: 05-13-15 11:05 PM  Result Value Ref Range Status   MRSA by PCR NEGATIVE NEGATIVE Final    Comment:        The GeneXpert MRSA Assay (FDA approved for NASAL specimens only), is one component of a comprehensive MRSA colonization surveillance program. It is not intended to diagnose MRSA infection nor to guide or monitor treatment for MRSA infections.   Urine culture     Status: None   Collection Time: 05/07/15  8:32 AM  Result Value Ref Range Status   Specimen Description URINE, CATHETERIZED  Final   Special Requests Normal  Final   Culture NO GROWTH 2 DAYS  Final   Report Status 05/09/2015 FINAL  Final  Culture, blood (routine x 2)     Status: None   Collection Time: 05/07/15  8:43 AM  Result Value Ref Range Status   Specimen Description BLOOD RIGHT AC  Final   Special Requests BOTTLES DRAWN AEROBIC AND ANAEROBIC  7CC  Final   Culture NO GROWTH 5 DAYS  Final   Report Status 05/12/2015 FINAL  Final  Culture, blood (routine x 2)      Status: None   Collection Time: 05/07/15  8:55 AM  Result Value Ref Range Status   Specimen Description BLOOD RIGHT HAND  Final   Special Requests   Final    BOTTLES DRAWN AEROBIC AND ANAEROBIC  AER 7CC ANA 10CC   Culture NO GROWTH 5 DAYS  Final   Report Status 05/12/2015 FINAL  Final  Culture, respiratory (NON-Expectorated)     Status: None   Collection Time: 05/09/15  3:47 PM  Result Value Ref Range Status   Specimen Description TRACHEAL ASPIRATE  Final   Special Requests NONE  Final   Gram Stain   Final    EXCELLENT SPECIMEN - 90-100% WBCS MODERATE WBC SEEN RARE GRAM POSITIVE COCCI    Culture APPEARS TO BE NORMAL RESPIRATORY FLORA  Final   Report Status 05/11/2015 FINAL  Final  Culture, blood (routine x 2)     Status: None   Collection Time: 05/13/15 12:46 PM  Result Value Ref Range Status   Specimen Description BLOOD RIGHT AC  Final   Special Requests BOTTLES DRAWN AEROBIC AND ANAEROBIC  15CC  Final   Culture NO GROWTH 5 DAYS  Final   Report Status 05/18/2015 FINAL  Final  Culture, blood (routine x 2)     Status: None   Collection Time: 05/13/15 12:57 PM  Result Value Ref Range Status   Specimen Description BLOOD RIGHT WRIST  Final   Special Requests   Final    BOTTLES DRAWN AEROBIC AND ANAEROBIC  AER 15CC ANA 10CC   Culture NO GROWTH 5 DAYS  Final   Report Status 05/18/2015 FINAL  Final  Urine culture     Status: None   Collection Time: 05/13/15  2:52 PM  Result Value Ref Range Status   Specimen Description URINE, RANDOM  Final   Special Requests NONE  Final   Culture NO GROWTH 2 DAYS  Final   Report Status 05/15/2015 FINAL  Final  Culture, respiratory (NON-Expectorated)     Status: None   Collection Time: 05/13/15  3:16 PM  Result Value Ref Range Status   Specimen Description TRACHEAL ASPIRATE  Final   Special Requests NONE  Final   Gram Stain   Final    MODERATE WBC SEEN NO ORGANISMS SEEN GOOD SPECIMEN - 80-90% WBCS    Culture NO GROWTH 2 DAYS  Final    Report Status 05/15/2015 FINAL  Final  Culture, blood (routine x 2)     Status: None   Collection Time: 05/14/15  7:34 PM  Result Value Ref Range Status   Specimen Description BLOOD RIGHT ARM  Final   Special Requests BOTTLES DRAWN AEROBIC AND ANAEROBIC 7CC  Final   Culture NO GROWTH 5 DAYS  Final   Report Status 05/05/2015 FINAL  Final  Culture, blood (routine x 2)     Status: None   Collection Time: 05/14/15  7:38 PM  Result Value  Ref Range Status   Specimen Description BLOOD RIGHT ARM  Final   Special Requests BOTTLES DRAWN AEROBIC AND ANAEROBIC 7CC  Final   Culture NO GROWTH 5 DAYS  Final   Report Status 05/25/2015 FINAL  Final  Urine culture     Status: None   Collection Time: 05/14/15 10:45 PM  Result Value Ref Range Status   Specimen Description URINE, RANDOM  Final   Special Requests NONE  Final   Culture NO GROWTH 2 DAYS  Final   Report Status 05/16/2015 FINAL  Final  Stool culture     Status: None   Collection Time: 05/14/15 10:45 PM  Result Value Ref Range Status   Specimen Description STOOL  Final   Special Requests NONE  Final   Culture   Final    NO SALMONELLA OR SHIGELLA ISOLATED NO CAMPYLOBACTER DETECTED No Pathogenic E. coli detected REDUCED NORMAL FECAL FLORA    Report Status 05/17/2015 FINAL  Final  C difficile quick scan w PCR reflex     Status: None   Collection Time: 05/15/15 11:08 PM  Result Value Ref Range Status   C Diff antigen NEGATIVE NEGATIVE Final   C Diff toxin NEGATIVE NEGATIVE Final   C Diff interpretation Negative for C. difficile  Final    CODE STATUS:     Code Status Orders        Start     Ordered   05/11/2015 1518  DNR (Do not attempt resuscitation)   Continuous    Question Answer Comment  In the event of cardiac or respiratory ARREST Do not call a "code blue"   In the event of cardiac or respiratory ARREST Do not perform Intubation, CPR, defibrillation or ACLS   In the event of cardiac or respiratory ARREST Use medication by  any route, position, wound care, and other measures to relive pain and suffering. May use oxygen, suction and manual treatment of airway obstruction as needed for comfort.      05/18/2015 1518      Duwaine MaxinWIETING, Edwar Coe M.D on 05/05/2015 at 3:41 PM  Between 7am to 6pm - Pager - [phone]  After 6pm go to [email or web address] - password [email or web address] [facility]  [facility]  Office  [phone]  CC: Primary care physician; No PCP Per Patient

## 2015-05-31 NOTE — Progress Notes (Signed)
Pronouncement of death    Pt was found to be without heart sounds, breath sounds, respirations, or pulses.  Pupils were fixed and dilated. Family was bedside.  Pts rhythm was PEA at 3:26 pm but by 3:30, rhythm was asystole.  Death was expected and pt died peacefully. See today's note for details of today's events.  Time of death: 3:26 pm.

## 2015-05-31 NOTE — Progress Notes (Signed)
ME case due to initial diagnosis of OD and SI. Notified ME on duty Dr. Algie Cofferinker. Will defer to Asencion PartridgeKeith Harris ME. Asencion PartridgeKeith Harris notified by [facility].

## 2015-05-31 DEATH — deceased

## 2015-07-31 NOTE — Progress Notes (Signed)
Pharmacy notified.

## 2016-04-22 IMAGING — CR DG CHEST 1V PORT
1 series · 1 of 1 positions shown · non-contrast
Comparison: 05/12/2015 and 05/11/2015 and 05/08/2015

CLINICAL DATA: Respiratory failure.  Pulmonary infiltrates.

EXAM:
PORTABLE CHEST 1 VIEW

[portable]
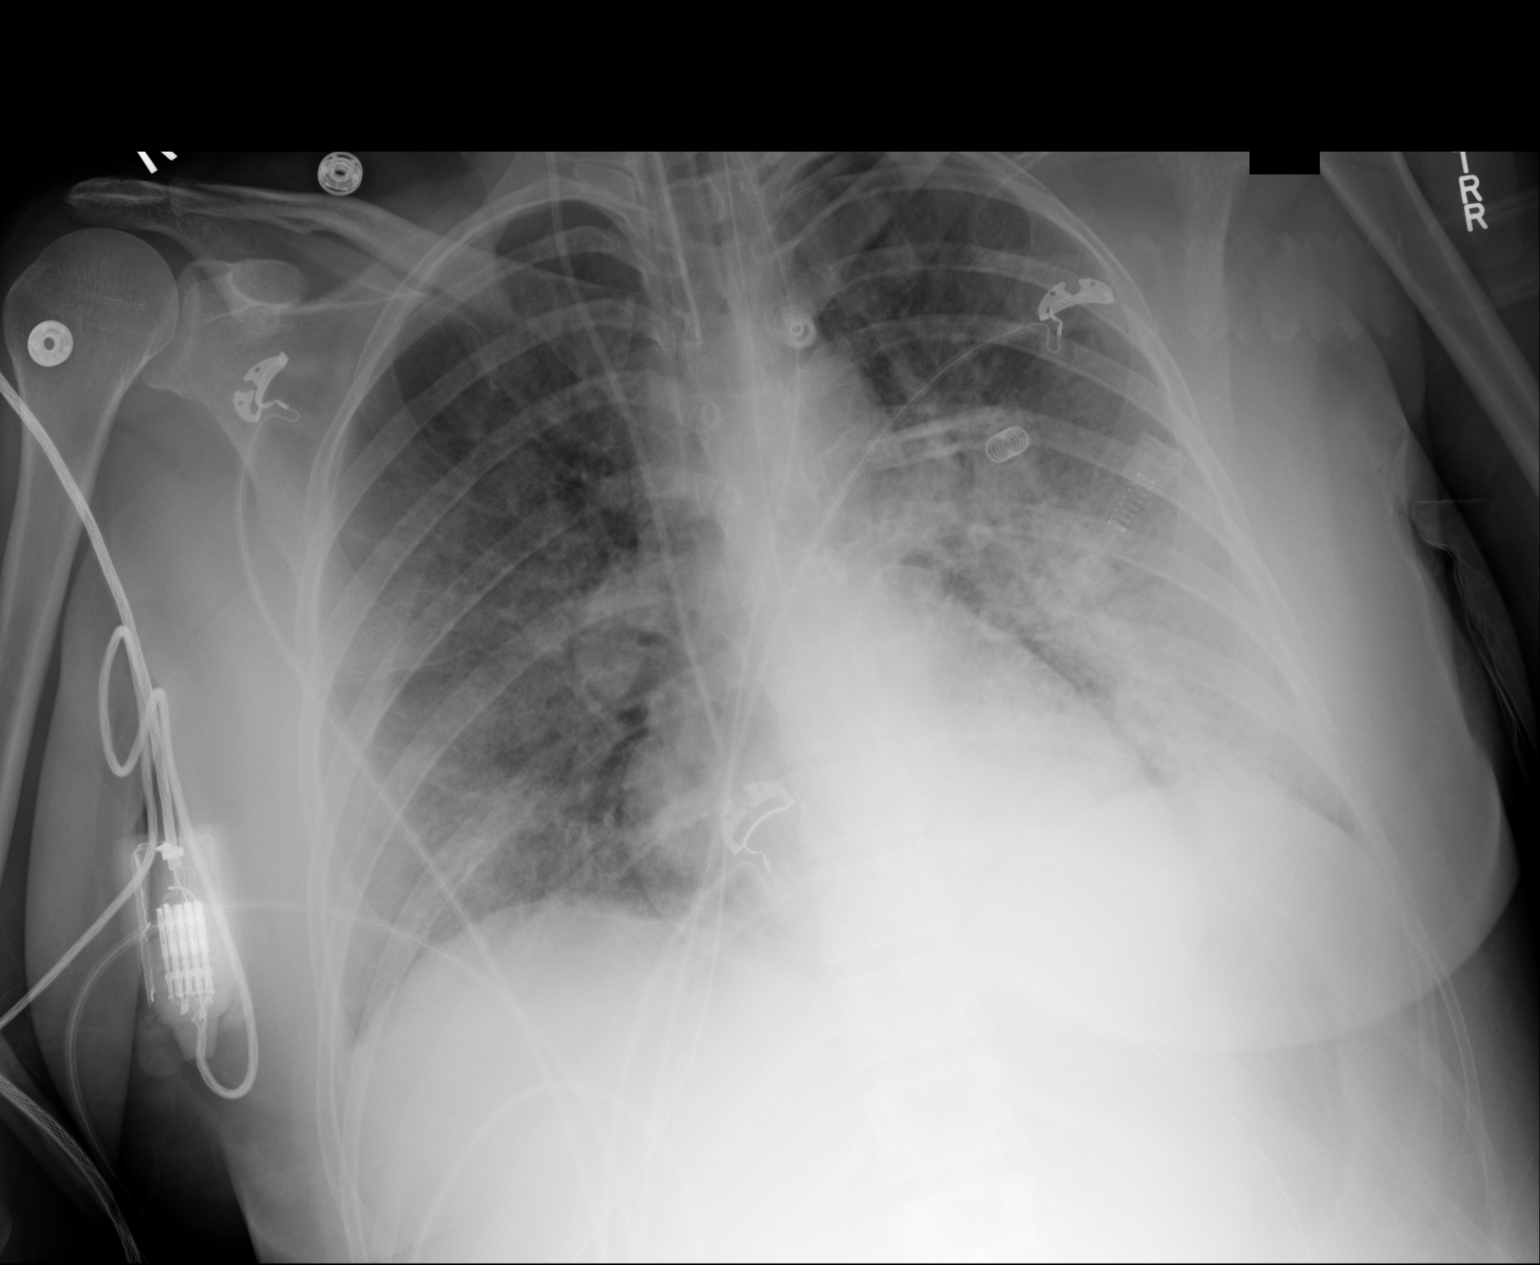

[1 of 1 positions shown; findings below may reference images not displayed]

FINDINGS: Endotracheal, NG tube and central line all appear in good position.
Bilateral pulmonary infiltrates have improved. No effusions. Heart
size and vascularity are normal.
IMPRESSION: Interval improvement in the bilateral pulmonary infiltrates.
